# Patient Record
Sex: Male | Born: 1965 | Race: White | Hispanic: No | Marital: Single | Smoking: Former smoker
Health system: Southern US, Community
[De-identification: ages and names within clinical notes are randomized; demographics above are authoritative.]

## PROBLEM LIST (undated history)

## (undated) DIAGNOSIS — E559 Vitamin D deficiency, unspecified: Secondary | ICD-10-CM

## (undated) DIAGNOSIS — N183 Chronic kidney disease, stage 3 unspecified: Secondary | ICD-10-CM

## (undated) DIAGNOSIS — K219 Gastro-esophageal reflux disease without esophagitis: Secondary | ICD-10-CM

## (undated) DIAGNOSIS — N529 Male erectile dysfunction, unspecified: Secondary | ICD-10-CM

## (undated) DIAGNOSIS — E782 Mixed hyperlipidemia: Secondary | ICD-10-CM

## (undated) HISTORY — DX: Chronic kidney disease, stage 3 unspecified: N18.30

## (undated) HISTORY — DX: Mixed hyperlipidemia: E78.2

## (undated) HISTORY — DX: Gastro-esophageal reflux disease without esophagitis: K21.9

---
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
## 2014-09-13 ENCOUNTER — Ambulatory Visit (INDEPENDENT_AMBULATORY_CARE_PROVIDER_SITE_OTHER): Payer: BC Managed Care – PPO | Admitting: Physician Assistant

## 2014-09-13 ENCOUNTER — Encounter (INDEPENDENT_AMBULATORY_CARE_PROVIDER_SITE_OTHER): Payer: Self-pay

## 2014-09-13 ENCOUNTER — Encounter: Payer: Self-pay | Admitting: Physician Assistant

## 2014-09-13 VITALS — BP 130/80 | HR 79 | Temp 97.6°F | Ht 67.0 in | Wt 179.2 lb

## 2014-09-13 DIAGNOSIS — J069 Acute upper respiratory infection, unspecified: Secondary | ICD-10-CM

## 2014-09-13 NOTE — Patient Instructions (Signed)
Upper Respiratory Infection, Adult An upper respiratory infection (URI) is also sometimes known as the common cold. The upper respiratory tract includes the nose, sinuses, throat, trachea, and bronchi. Bronchi are the airways leading to the lungs. Most people improve within 1 week, but symptoms can last up to 2 weeks. A residual cough may last even longer.  CAUSES Many different viruses can infect the tissues lining the upper respiratory tract. The tissues become irritated and inflamed and often become very moist. Mucus production is also common. A cold is contagious. You can easily spread the virus to others by oral contact. This includes kissing, sharing a glass, coughing, or sneezing. Touching your mouth or nose and then touching a surface, which is then touched by another person, can also spread the virus. SYMPTOMS  Symptoms typically develop 1 to 3 days after you come in contact with a cold virus. Symptoms vary from person to person. They may include:  Runny nose.  Sneezing.  Nasal congestion.  Sinus irritation.  Sore throat.  Loss of voice (laryngitis).  Cough.  Fatigue.  Muscle aches.  Loss of appetite.  Headache.  Low-grade fever. DIAGNOSIS  You might diagnose your own cold based on familiar symptoms, since most people get a cold 2 to 3 times a year. Your caregiver can confirm this based on your exam. Most importantly, your caregiver can check that your symptoms are not due to another disease such as strep throat, sinusitis, pneumonia, asthma, or epiglottitis. Blood tests, throat tests, and X-rays are not necessary to diagnose a common cold, but they may sometimes be helpful in excluding other more serious diseases. Your caregiver will decide if any further tests are required. RISKS AND COMPLICATIONS  You may be at risk for a more severe case of the common cold if you smoke cigarettes, have chronic heart disease (such as heart failure) or lung disease (such as asthma), or if  you have a weakened immune system. The very young and very old are also at risk for more serious infections. Bacterial sinusitis, middle ear infections, and bacterial pneumonia can complicate the common cold. The common cold can worsen asthma and chronic obstructive pulmonary disease (COPD). Sometimes, these complications can require emergency medical care and may be life-threatening. PREVENTION  The best way to protect against getting a cold is to practice good hygiene. Avoid oral or hand contact with people with cold symptoms. Wash your hands often if contact occurs. There is no clear evidence that vitamin C, vitamin E, echinacea, or exercise reduces the chance of developing a cold. However, it is always recommended to get plenty of rest and practice good nutrition. TREATMENT  Treatment is directed at relieving symptoms. There is no cure. Antibiotics are not effective, because the infection is caused by a virus, not by bacteria. Treatment may include:  Increased fluid intake. Sports drinks offer valuable electrolytes, sugars, and fluids.  Breathing heated mist or steam (vaporizer or shower).  Eating chicken soup or other clear broths, and maintaining good nutrition.  Getting plenty of rest.  Using gargles or lozenges for comfort.  Controlling fevers with ibuprofen or acetaminophen as directed by your caregiver.  Increasing usage of your inhaler if you have asthma. Zinc gel and zinc lozenges, taken in the first 24 hours of the common cold, can shorten the duration and lessen the severity of symptoms. Pain medicines may help with fever, muscle aches, and throat pain. A variety of non-prescription medicines are available to treat congestion and runny nose. Your caregiver   can make recommendations and may suggest nasal or lung inhalers for other symptoms.  HOME CARE INSTRUCTIONS   Only take over-the-counter or prescription medicines for pain, discomfort, or fever as directed by your  caregiver.  Use a warm mist humidifier or inhale steam from a shower to increase air moisture. This may keep secretions moist and make it easier to breathe.  Drink enough water and fluids to keep your urine clear or pale yellow.  Rest as needed.  Return to work when your temperature has returned to normal or as your caregiver advises. You may need to stay home longer to avoid infecting others. You can also use a face mask and careful hand washing to prevent spread of the virus. SEEK MEDICAL CARE IF:   After the first few days, you feel you are getting worse rather than better.  You need your caregiver's advice about medicines to control symptoms.  You develop chills, worsening shortness of breath, or brown or red sputum. These may be signs of pneumonia.  You develop yellow or brown nasal discharge or pain in the face, especially when you bend forward. These may be signs of sinusitis.  You develop a fever, swollen neck glands, pain with swallowing, or white areas in the back of your throat. These may be signs of strep throat. SEEK IMMEDIATE MEDICAL CARE IF:   You have a fever.  You develop severe or persistent headache, ear pain, sinus pain, or chest pain.  You develop wheezing, a prolonged cough, cough up blood, or have a change in your usual mucus (if you have chronic lung disease).  You develop sore muscles or a stiff neck. Document Released: 09/08/2000 Document Revised: 06/07/2011 Document Reviewed: 06/20/2013 [employer] Patient Information 2015 [employer]. This information is not intended to replace advice given to you by your health care provider. Make sure you discuss any questions you have with your health care provider.  

## 2014-09-13 NOTE — Progress Notes (Signed)
Subjective:     Patient ID: Gregory Baas., male   DOB: 10/31/1965, 49 y.o.   MRN: [id number]  HPI Pt with ear pain, S/T, and now cough No fever /chills OTC Ibuprofen for sx   Review of Systems  Constitutional: Negative.   HENT: Positive for congestion, ear pain, postnasal drip and sore throat. Negative for rhinorrhea, sinus pressure, sneezing and voice change.   Respiratory: Positive for cough.   Cardiovascular: Negative.        Objective:   Physical Exam  Constitutional: He appears well-developed and well-nourished.  HENT:  Right Ear: External ear normal.  Left Ear: External ear normal.  Mouth/Throat: Oropharynx is clear and moist. No oropharyngeal exudate.  Cerumen to R ear canal but TM with nl landmarks  Neck: Neck supple.  Cardiovascular: Normal rate, regular rhythm and normal heart sounds.   Pulmonary/Chest: Effort normal and breath sounds normal.  Lymphadenopathy:    He has no cervical adenopathy.  Nursing note and vitals reviewed.      Assessment:     1. Acute upper respiratory infection        Plan:     Fluids Rest Salt water gargles OTC Antihist/decongest

## 2014-09-18 ENCOUNTER — Ambulatory Visit (INDEPENDENT_AMBULATORY_CARE_PROVIDER_SITE_OTHER): Payer: BC Managed Care – PPO | Admitting: Family Medicine

## 2014-09-18 ENCOUNTER — Telehealth: Payer: Self-pay | Admitting: Family Medicine

## 2014-09-18 VITALS — BP 118/70 | HR 83 | Temp 98.2°F | Ht 67.0 in | Wt 176.2 lb

## 2014-09-18 DIAGNOSIS — J0110 Acute frontal sinusitis, unspecified: Secondary | ICD-10-CM | POA: Diagnosis not present

## 2014-09-18 MED ORDER — LEVOFLOXACIN 500 MG PO TABS: 500.0000 mg | ORAL_TABLET | Freq: Every day | ORAL | Status: DC

## 2014-09-18 MED ORDER — BETAMETHASONE SOD PHOS & ACET 6 (3-3) MG/ML IJ SUSP
12.0000 mg | Freq: Once | INTRAMUSCULAR | Status: AC
  Administered 2014-09-18: 12 mg via INTRAMUSCULAR

## 2014-09-18 NOTE — Progress Notes (Signed)
Subjective:  Patient ID: Gregory Baas., male    DOB: June 29, 1965  Age: 49 y.o. MRN: [id number]  CC: URI   HPI Gregory Charles. presents for 10  Days of ST, congestion & cough worsening is spite of using Claritin D then Dayquil/Nyquil  History Gregory Charles has no past medical history on file.   He has no past surgical history on file.   His family history is not on file.He reports that he has quit smoking. He does not have any smokeless tobacco history on file. He reports that he drinks alcohol. He reports that he does not use illicit drugs.  No outpatient prescriptions prior to visit.   No facility-administered medications prior to visit.    ROS Review of Systems  Constitutional: Negative for fever, chills, activity change and appetite change.  HENT: Positive for congestion, postnasal drip, rhinorrhea and sinus pressure. Negative for ear discharge, ear pain, hearing loss, nosebleeds, sneezing and trouble swallowing.   Respiratory: Negative for chest tightness and shortness of breath.   Cardiovascular: Negative for chest pain and palpitations.  Skin: Negative for rash.    Objective:  BP 118/70 mmHg  Pulse 83  Temp(Src) 98.2 F (36.8 C) (Oral)  Ht 5\' 7"  (1.702 m)  Wt 176 lb 3.2 oz (79.924 kg)  BMI 27.59 kg/m2  BP Readings from Last 3 Encounters:  09/18/14 118/70  09/13/14 130/80    Wt Readings from Last 3 Encounters:  09/18/14 176 lb 3.2 oz (79.924 kg)  09/13/14 179 lb 3.2 oz (81.285 kg)     Physical Exam  Constitutional: He appears well-developed and well-nourished.  HENT:  Head: Normocephalic and atraumatic.  Right Ear: Tympanic membrane and external ear normal. No decreased hearing is noted.  Left Ear: Tympanic membrane and external ear normal. No decreased hearing is noted.  Nose: Mucosal edema present. Right sinus exhibits frontal sinus tenderness. Left sinus exhibits frontal sinus tenderness.  Mouth/Throat: No oropharyngeal exudate or posterior  oropharyngeal erythema.  Neck: No Brudzinski's sign noted.  Pulmonary/Chest: No respiratory distress. He has wheezes.  Lymphadenopathy:       Head (right side): No preauricular adenopathy present.       Head (left side): No preauricular adenopathy present.       Right cervical: No superficial cervical adenopathy present.      Left cervical: No superficial cervical adenopathy present.    No results found for: HGBA1C  No results found for: WBC, HGB, HCT, PLT, GLUCOSE, CHOL, TRIG, HDL, LDLDIRECT, LDLCALC, ALT, AST, NA, K, CL, CREATININE, BUN, CO2, TSH, PSA, INR, GLUF, HGBA1C, MICROALBUR  Patient was never admitted.  Assessment & Plan:   Gregory Charles was seen today for uri.  Diagnoses and all orders for this visit:  Acute frontal sinusitis, recurrence not specified Orders: -     betamethasone acetate-betamethasone sodium phosphate (CELESTONE) injection 12 mg; Inject 2 mLs (12 mg total) into the muscle once.  Other orders -     levofloxacin (LEVAQUIN) 500 MG tablet; Take 1 tablet (500 mg total) by mouth daily.   I am having Gregory Charles start on levofloxacin. We administered betamethasone acetate-betamethasone sodium phosphate.  Meds ordered this encounter  Medications  . levofloxacin (LEVAQUIN) 500 MG tablet    Sig: Take 1 tablet (500 mg total) by mouth daily.    Dispense:  10 tablet    Refill:  0  . betamethasone acetate-betamethasone sodium phosphate (CELESTONE) injection 12 mg    Sig:      Follow-up: No  Follow-up on file.  Claretta Fraise, M.D.

## 2014-09-18 NOTE — Telephone Encounter (Signed)
appt made

## 2015-01-07 ENCOUNTER — Telehealth: Payer: Self-pay | Admitting: Family Medicine

## 2015-01-07 ENCOUNTER — Encounter: Payer: Self-pay | Admitting: Family Medicine

## 2015-01-07 ENCOUNTER — Ambulatory Visit (INDEPENDENT_AMBULATORY_CARE_PROVIDER_SITE_OTHER): Payer: BC Managed Care – PPO | Admitting: Family Medicine

## 2015-01-07 VITALS — BP 122/79 | HR 77 | Temp 97.9°F | Ht 67.0 in | Wt 174.4 lb

## 2015-01-07 DIAGNOSIS — R5383 Other fatigue: Secondary | ICD-10-CM | POA: Insufficient documentation

## 2015-01-07 DIAGNOSIS — M6740 Ganglion, unspecified site: Secondary | ICD-10-CM | POA: Insufficient documentation

## 2015-01-07 DIAGNOSIS — M25519 Pain in unspecified shoulder: Secondary | ICD-10-CM | POA: Insufficient documentation

## 2015-01-07 DIAGNOSIS — Z0000 Encounter for general adult medical examination without abnormal findings: Secondary | ICD-10-CM | POA: Diagnosis not present

## 2015-01-07 DIAGNOSIS — R5382 Chronic fatigue, unspecified: Secondary | ICD-10-CM

## 2015-01-07 DIAGNOSIS — M25511 Pain in right shoulder: Secondary | ICD-10-CM

## 2015-01-07 MED ORDER — NITROGLYCERIN 0.4 MG/HR TD PT24: MEDICATED_PATCH | TRANSDERMAL | Status: DC

## 2015-01-07 NOTE — Telephone Encounter (Signed)
[facility] is faxing over paper for rejection of rx

## 2015-01-07 NOTE — Patient Instructions (Addendum)
  Great to see you!  Come back between 8 and 10 am at your convenience for the testosterone test.    Nitroglycerin Protocol   Apply 1/4 nitroglycerin patch to affected area daily.  Change position of patch within the affected area every 24 hours.  You may experience a headache during the first 1-2 weeks of using the patch, these should subside.  If you experience headaches after beginning nitroglycerin patch treatment, you may take your preferred over the counter pain reliever.  Another side effect of the nitroglycerin patch is skin irritation or rash related to patch adhesive.  Please notify our office if you develop more severe headaches or rash, and stop the patch.  Tendon healing with nitroglycerin patch may require 12 to 24 weeks depending on the extent of injury.  Men should not use if taking Viagra, Cialis, or Levitra.   Do not use if you have migraines or rosacea.
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
## 2015-01-08 LAB — CMP14+EGFR
ALBUMIN: 4.6 g/dL (ref 3.5–5.5)
ALK PHOS: 92 IU/L (ref 39–117)
ALT: 20 IU/L (ref 0–44)
AST: 17 IU/L (ref 0–40)
Albumin/Globulin Ratio: 1.9 (ref 1.1–2.5)
BILIRUBIN TOTAL: 0.7 mg/dL (ref 0.0–1.2)
BUN / CREAT RATIO: 7 — AB (ref 9–20)
BUN: 9 mg/dL (ref 6–24)
CHLORIDE: 98 mmol/L (ref 97–108)
CO2: 25 mmol/L (ref 18–29)
Calcium: 9.7 mg/dL (ref 8.7–10.2)
Creatinine, Ser: 1.38 mg/dL — ABNORMAL HIGH (ref 0.76–1.27)
GFR calc Af Amer: 70 mL/min/{1.73_m2} (ref >59)
GFR calc non Af Amer: 60 mL/min/{1.73_m2} (ref >59)
GLOBULIN, TOTAL: 2.4 g/dL (ref 1.5–4.5)
Glucose: 93 mg/dL (ref 65–99)
Potassium: 4.4 mmol/L (ref 3.5–5.2)
SODIUM: 138 mmol/L (ref 134–144)
TOTAL PROTEIN: 7 g/dL (ref 6.0–8.5)

## 2015-01-08 LAB — LIPID PANEL
Chol/HDL Ratio: 5.4 ratio units — ABNORMAL HIGH (ref 0.0–5.0)
Cholesterol, Total: 231 mg/dL — ABNORMAL HIGH (ref 100–199)
HDL: 43 mg/dL (ref >39)
LDL Calculated: 147 mg/dL — ABNORMAL HIGH (ref 0–99)
TRIGLYCERIDES: 206 mg/dL — AB (ref 0–149)
VLDL Cholesterol Cal: 41 mg/dL — ABNORMAL HIGH (ref 5–40)

## 2015-01-08 LAB — CBC
HEMATOCRIT: 45.3 % (ref 37.5–51.0)
Hemoglobin: 16.1 g/dL (ref 12.6–17.7)
MCH: 33.1 pg — ABNORMAL HIGH (ref 26.6–33.0)
MCHC: 35.5 g/dL (ref 31.5–35.7)
MCV: 93 fL (ref 79–97)
Platelets: 247 10*3/uL (ref 150–379)
RBC: 4.87 x10E6/uL (ref 4.14–5.80)
RDW: 13.6 % (ref 12.3–15.4)
WBC: 7.8 10*3/uL (ref 3.4–10.8)

## 2015-01-08 NOTE — Progress Notes (Signed)
Patient aware.

## 2015-01-09 ENCOUNTER — Telehealth: Payer: Self-pay

## 2015-01-09 NOTE — Telephone Encounter (Signed)
I have to prior authorize Nitroglycerin patch for patient   Are you using this for pain in his shoulder?

## 2015-02-07 ENCOUNTER — Encounter: Payer: Self-pay | Admitting: Family Medicine

## 2015-02-07 ENCOUNTER — Ambulatory Visit (INDEPENDENT_AMBULATORY_CARE_PROVIDER_SITE_OTHER): Payer: BC Managed Care – PPO | Admitting: Family Medicine

## 2015-02-07 ENCOUNTER — Ambulatory Visit (INDEPENDENT_AMBULATORY_CARE_PROVIDER_SITE_OTHER): Payer: BC Managed Care – PPO

## 2015-02-07 VITALS — BP 116/73 | HR 83 | Temp 97.9°F | Ht 67.0 in | Wt 178.4 lb

## 2015-02-07 DIAGNOSIS — M7521 Bicipital tendinitis, right shoulder: Secondary | ICD-10-CM

## 2015-02-07 DIAGNOSIS — M25511 Pain in right shoulder: Secondary | ICD-10-CM

## 2015-02-07 DIAGNOSIS — R5382 Chronic fatigue, unspecified: Secondary | ICD-10-CM

## 2015-02-07 MED ORDER — TRAMADOL HCL 50 MG PO TABS: 50.0000 mg | ORAL_TABLET | Freq: Four times a day (QID) | ORAL | Status: DC | PRN

## 2015-02-07 MED ORDER — PREDNISONE 10 MG PO TABS: ORAL_TABLET | ORAL | Status: DC

## 2015-02-07 NOTE — Progress Notes (Signed)
Subjective:  Patient ID: Gregory BaasHARLES H Rotenberg Jr., male    DOB: Mar 06, 1966  Age: 49 y.o. MRN: [id number]  CC: Shoulder Pain   HPI Gregory BaasHARLES H Power Jr. presents for intense right shoulder pain in spite of home physical therapy. He was treated by Dr. Ermalinda MemosBradshaw recently with nitroglycerin patches that caused a headache that was too intense to allow him to continue with the treatment. At the same time he was trying to do home physical therapy with an exercise regimen provided at the same evaluation. He states that the exercises have not helped. He requests an MRI of the shoulder and would like to avoid physical therapy formally until he knows if he has a surgical lesion. Pain is anterior. He points to the anterior aspect of the deltoid near its insertion. He says it is painful for arm flexion.  History Gregory Charles has no past medical history on file.   He has no past surgical history on file.   His family history is not on file.He reports that he has quit smoking. He does not have any smokeless tobacco history on file. He reports that he drinks alcohol. He reports that he does not use illicit drugs.  Outpatient Prescriptions Prior to Visit  Medication Sig Dispense Refill  . nitroGLYCERIN (NITRO-DUR) 0.4 mg/hr patch Place 1/4 of the patch to the affected area daily. (Patient not taking: Reported on 02/07/2015) 10 patch 2   No facility-administered medications prior to visit.    ROS Review of Systems  Constitutional: Negative for fever, chills and diaphoresis.  HENT: Negative for congestion and rhinorrhea.   Respiratory: Negative for cough and shortness of breath.   Cardiovascular: Negative for chest pain and palpitations.  Musculoskeletal: Positive for myalgias, joint swelling and arthralgias.  Skin: Negative for rash.  Neurological: Negative for headaches.    Objective:  BP 116/73 mmHg  Pulse 83  Temp(Src) 97.9 F (36.6 C) (Oral)  Ht 5\' 7"  (1.702 m)  Wt 178 lb 6.4 oz (80.922 kg)  BMI  27.93 kg/m2  SpO2 96%  BP Readings from Last 3 Encounters:  02/07/15 116/73  01/07/15 122/79  09/18/14 118/70    Wt Readings from Last 3 Encounters:  02/07/15 178 lb 6.4 oz (80.922 kg)  01/07/15 174 lb 6.4 oz (79.107 kg)  09/18/14 176 lb 3.2 oz (79.924 kg)     Physical Exam  Constitutional: He is oriented to person, place, and time. He appears well-developed and well-nourished.  HENT:  Head: Normocephalic and atraumatic.  Eyes: Pupils are equal, round, and reactive to light.  Cardiovascular: Normal rate and regular rhythm.   No murmur heard. Pulmonary/Chest: Effort normal and breath sounds normal.  Musculoskeletal: He exhibits tenderness (tenderness at the origin of the biceps tendon. Excruciating pain with resisted flexion and resisted supination of the right biceps).  Neurological: He is alert and oriented to person, place, and time. He displays normal reflexes. He exhibits normal muscle tone. Coordination normal.  Skin: Skin is warm and dry.  Psychiatric: He has a normal mood and affect.    No results found for: HGBA1C  Lab Results  Component Value Date   WBC 7.8 01/07/2015   HCT 45.3 01/07/2015   GLUCOSE 93 01/07/2015   CHOL 231* 01/07/2015   TRIG 206* 01/07/2015   HDL 43 01/07/2015   LDLCALC 147* 01/07/2015   ALT 20 01/07/2015   AST 17 01/07/2015   NA 138 01/07/2015   K 4.4 01/07/2015   CL 98 01/07/2015   CREATININE 1.38*  01/07/2015   BUN 9 01/07/2015   CO2 25 01/07/2015    Patient was never admitted.  Assessment & Plan:   Charles was seen today for shoulder pain.  Diagnoses and all orders for this visit:  Right shoulder pain -     DG Shoulder Right -     MR Shoulder Right Wo Contrast; Future  Chronic fatigue -     Testosterone,Free and Total  Biceps tendonitis on right -     MR Shoulder Right Wo Contrast; Future  Other orders -     predniSONE (DELTASONE) 10 MG tablet; Take 5 daily for 3 days followed by 4,3,2 and 1 for 3 days each. -      traMADol (ULTRAM) 50 MG tablet; Take 1 tablet (50 mg total) by mouth 4 (four) times daily as needed for moderate pain.   I have discontinued [name] nitroGLYCERIN. I am also having him start on predniSONE and traMADol.  Meds ordered this encounter  Medications  . predniSONE (DELTASONE) 10 MG tablet    Sig: Take 5 daily for 3 days followed by 4,3,2 and 1 for 3 days each.    Dispense:  45 tablet    Refill:  0  . traMADol (ULTRAM) 50 MG tablet    Sig: Take 1 tablet (50 mg total) by mouth 4 (four) times daily as needed for moderate pain.    Dispense:  60 tablet    Refill:  02     Follow-up: Return in about 2 weeks (around 02/21/2015), or if symptoms worsen or fail to improve.  Mechele Claude, M.D.

## 2015-02-08 LAB — TESTOSTERONE,FREE AND TOTAL
TESTOSTERONE: 460 ng/dL (ref 348–1197)
Testosterone, Free: 14.8 pg/mL (ref 6.8–21.5)

## 2015-02-21 ENCOUNTER — Ambulatory Visit (HOSPITAL_COMMUNITY)
Admission: RE | Admit: 2015-02-21 | Discharge: 2015-02-21 | Disposition: A | Discharge status: Home / Self Care | Payer: BC Managed Care – PPO | Source: Ambulatory Visit | Attending: Family Medicine | Admitting: Family Medicine

## 2015-02-21 ENCOUNTER — Other Ambulatory Visit: Payer: Self-pay | Admitting: Family Medicine

## 2015-02-21 ENCOUNTER — Ambulatory Visit (HOSPITAL_COMMUNITY): Payer: BC Managed Care – PPO

## 2015-02-21 DIAGNOSIS — M7521 Bicipital tendinitis, right shoulder: Secondary | ICD-10-CM

## 2015-02-21 DIAGNOSIS — S43432A Superior glenoid labrum lesion of left shoulder, initial encounter: Secondary | ICD-10-CM

## 2015-02-21 DIAGNOSIS — M19011 Primary osteoarthritis, right shoulder: Secondary | ICD-10-CM | POA: Insufficient documentation

## 2015-02-21 DIAGNOSIS — M25511 Pain in right shoulder: Secondary | ICD-10-CM

## 2015-02-21 DIAGNOSIS — Z01818 Encounter for other preprocedural examination: Secondary | ICD-10-CM | POA: Diagnosis not present

## 2015-06-18 ENCOUNTER — Encounter: Payer: Self-pay | Admitting: Family Medicine

## 2015-06-18 ENCOUNTER — Ambulatory Visit (INDEPENDENT_AMBULATORY_CARE_PROVIDER_SITE_OTHER): Payer: BC Managed Care – PPO | Admitting: Family Medicine

## 2015-06-18 VITALS — BP 121/80 | HR 84 | Temp 98.0°F | Ht 67.0 in | Wt 173.6 lb

## 2015-06-18 DIAGNOSIS — S0086XA Insect bite (nonvenomous) of other part of head, initial encounter: Secondary | ICD-10-CM | POA: Diagnosis not present

## 2015-06-18 DIAGNOSIS — W57XXXA Bitten or stung by nonvenomous insect and other nonvenomous arthropods, initial encounter: Secondary | ICD-10-CM

## 2015-06-18 MED ORDER — MUPIROCIN CALCIUM 2 % EX CREA: 1.0000 "application " | TOPICAL_CREAM | Freq: Two times a day (BID) | CUTANEOUS | Status: DC

## 2015-06-18 MED ORDER — CEPHALEXIN 500 MG PO CAPS: 500.0000 mg | ORAL_CAPSULE | Freq: Four times a day (QID) | ORAL | Status: DC

## 2015-06-18 NOTE — Progress Notes (Signed)
   Subjective:    Patient ID: Gregory BaasHARLES H Auzenne Jr., male    DOB: 11/10/1965, 50 y.o.   MRN: [id number]  HPI patient was bit by unknown insect about a week ago area in question is getting redder and there is more swelling below. The actual bite is on the bridge of the nose on the left side. He is also having some headaches. He has been doing some warm compresses.  Patient Active Problem List   Diagnosis Date Noted  . Biceps tendonitis on right 02/07/2015  . Annual physical exam 01/07/2015  . Pain in joint, shoulder region 01/07/2015  . Ganglion cyst 01/07/2015  . Fatigue 01/07/2015   Outpatient Encounter Prescriptions as of 06/18/2015  Medication Sig  . [DISCONTINUED] predniSONE (DELTASONE) 10 MG tablet Take 5 daily for 3 days followed by 4,3,2 and 1 for 3 days each.  . [DISCONTINUED] traMADol (ULTRAM) 50 MG tablet Take 1 tablet (50 mg total) by mouth 4 (four) times daily as needed for moderate pain.   No facility-administered encounter medications on file as of 06/18/2015.      Review of Systems  Skin: Positive for color change and wound.       Objective:   Physical Exam  Constitutional: He appears well-developed and well-nourished.  Skin:  Insect bite on left side of nose between eyes: Area is red and irritated with soft tissue swelling extending over to the malar area under the left eye. This is not orbital cellulitis but I think there is evidence of infection.          Assessment & Plan:  1. Insect bite of face with local reaction, initial encounter Will have patient alternate cool and warm compresses. Rx for Keflex 500 mg 4 times a day along with Bactroban topically.

## 2015-06-18 NOTE — Patient Instructions (Signed)
Thank you for allowing us to care for you today. We strive to provide exceptional quality and compassionate care. Please let us know how we are doing and how we can help serve you better by filling out the survey that you receive from [employer].     

## 2016-01-28 ENCOUNTER — Encounter: Payer: Self-pay | Admitting: Physician Assistant

## 2016-01-28 ENCOUNTER — Ambulatory Visit (INDEPENDENT_AMBULATORY_CARE_PROVIDER_SITE_OTHER): Payer: BC Managed Care – PPO | Admitting: Physician Assistant

## 2016-01-28 VITALS — BP 115/72 | HR 55 | Temp 98.4°F | Ht 67.0 in | Wt 177.4 lb

## 2016-01-28 DIAGNOSIS — H8113 Benign paroxysmal vertigo, bilateral: Secondary | ICD-10-CM

## 2016-01-28 MED ORDER — MECLIZINE HCL 12.5 MG PO TABS: 25.0000 mg | ORAL_TABLET | Freq: Three times a day (TID) | ORAL | 0 refills | Status: DC

## 2016-01-28 NOTE — Patient Instructions (Signed)

## 2016-01-30 NOTE — Progress Notes (Signed)
BP 115/72   Pulse (!) 55   Temp 98.4 F (36.9 C) (Oral)   Ht 5\' 7"  (1.702 m)   Wt 177 lb 6.4 oz (80.5 kg)   BMI 27.78 kg/m    Subjective:    Patient ID: Gregory BaasHARLES H Rayson Jr., male    DOB: 04-22-1965, 50 y.o.   MRN: [id number]  HPI: Gregory BaasCHARLES H Valencia Jr. is a 50 y.o. male presenting on 01/28/2016 for Dizziness (Started yesterday ) For two days has dizziness with changing positions. Dizziness is throughout head, No associated NVD, fever or chills. Denies any recent cold or URI symptoms. Has not cardiac history of bradycardia. Mild slowing today, will continue to monitor in coming days.    History reviewed. No pertinent past medical history. Relevant past medical, surgical, family and social history reviewed and updated as indicated. Interim medical history since our last visit reviewed. Allergies and medications reviewed and updated. DATA REVIEWED: CHART IN EPIC  Social History   Social History  . Marital status: Single    Spouse name: N/A  . Number of children: N/A  . Years of education: N/A   Occupational History  . Not on file.   Social History Main Topics  . Smoking status: Former Games developermoker  . Smokeless tobacco: Never Used  . Alcohol use Yes  . Drug use: No  . Sexual activity: Not on file   Other Topics Concern  . Not on file   Social History Narrative  . No narrative on file    History reviewed. No pertinent surgical history.  History reviewed. No pertinent family history.  Review of Systems  Constitutional: Negative.  Negative for appetite change and fatigue.  HENT: Negative.  Negative for ear discharge, ear pain, facial swelling, postnasal drip, rhinorrhea and sinus pressure.   Eyes: Negative.  Negative for pain, discharge, itching and visual disturbance.  Respiratory: Negative.  Negative for cough, chest tightness, shortness of breath and wheezing.   Cardiovascular: Negative.  Negative for chest pain, palpitations and leg swelling.  Gastrointestinal:  Negative.  Negative for abdominal pain, diarrhea, nausea and vomiting.  Endocrine: Negative.   Genitourinary: Negative.   Musculoskeletal: Negative.   Skin: Negative.  Negative for color change and rash.  Neurological: Positive for dizziness and light-headedness. Negative for tremors, seizures, syncope, facial asymmetry, weakness, numbness and headaches.  Psychiatric/Behavioral: Negative.       Medication List       Accurate as of 01/28/16 11:59 PM. Always use your most recent med list.          meclizine 12.5 MG tablet Commonly known as:  ANTIVERT Take 2 tablets (25 mg total) by mouth 3 (three) times daily.          Objective:    BP 115/72   Pulse (!) 55   Temp 98.4 F (36.9 C) (Oral)   Ht 5\' 7"  (1.702 m)   Wt 177 lb 6.4 oz (80.5 kg)   BMI 27.78 kg/m   No Known Allergies  Wt Readings from Last 3 Encounters:  01/28/16 177 lb 6.4 oz (80.5 kg)  06/18/15 173 lb 9.6 oz (78.7 kg)  02/21/15 172 lb (78 kg)    Physical Exam  Constitutional: He is oriented to person, place, and time. He appears well-developed and well-nourished. No distress.  HENT:  Head: Normocephalic and atraumatic.  Right Ear: External ear normal.  Left Ear: External ear normal.  Nose: Nose normal.  Mouth/Throat: Oropharynx is clear and moist. No oropharyngeal exudate.  Eyes:  Conjunctivae and EOM are normal. Pupils are equal, round, and reactive to light. Right eye exhibits no discharge. Left eye exhibits no discharge.  Neck: Normal range of motion. Neck supple. No tracheal deviation present. No thyromegaly present.  Cardiovascular: Normal rate, regular rhythm and normal heart sounds.   Pulmonary/Chest: Effort normal and breath sounds normal. No respiratory distress.  Neurological: He is alert and oriented to person, place, and time. He has normal strength. He is not disoriented. No sensory deficit. Coordination abnormal. Gait normal.  Skin: Skin is warm and dry.  Psychiatric: He has a normal mood  and affect. His behavior is normal.  Nursing note and vitals reviewed.       Assessment & Plan:   1. Benign paroxysmal positional vertigo due to bilateral vestibular disorder - meclizine (ANTIVERT) 12.5 MG tablet; Take 2 tablets (25 mg total) by mouth 3 (three) times daily.  Dispense: 30 tablet; Refill: 0   Continue all other maintenance medications as listed above.  Follow up plan: Return if symptoms worsen or fail to improve.   Educational handout given for vertigo  Remus LofflerAngel S. Shawnte Demarest PA-C [facility] [address]  [place], [place] [id number] [phone]   01/30/2016, 1:55 PM

## 2016-02-02 ENCOUNTER — Telehealth: Payer: Self-pay | Admitting: Physician Assistant

## 2016-02-02 NOTE — Telephone Encounter (Signed)
Probably smart to be seen again to try epleys maneuver and repeat exam.   Murtis SinkSam Carolan Avedisian, MD [facility] 02/02/2016, 5:20 PM

## 2016-02-02 NOTE — Telephone Encounter (Signed)
Patient was seen by Gregory Charles on 01/28/16 for vertigo and placed on Meclizine 25 mg, 2 tabs three times daily.  He has been taking the medication as prescribed but is still experiencing dizziness and lightheadedness.  He reports he does not have any sinus congestion, fluid in ears, cold symptoms.  Please advise.

## 2016-02-03 ENCOUNTER — Encounter: Payer: Self-pay | Admitting: Family

## 2016-02-03 ENCOUNTER — Ambulatory Visit (INDEPENDENT_AMBULATORY_CARE_PROVIDER_SITE_OTHER): Payer: BC Managed Care – PPO | Admitting: Family

## 2016-02-03 VITALS — BP 130/84 | HR 70 | Temp 97.3°F | Ht 67.0 in | Wt 179.0 lb

## 2016-02-03 DIAGNOSIS — R42 Dizziness and giddiness: Secondary | ICD-10-CM

## 2016-02-03 DIAGNOSIS — H539 Unspecified visual disturbance: Secondary | ICD-10-CM

## 2016-02-03 NOTE — Patient Instructions (Signed)

## 2016-02-03 NOTE — Progress Notes (Addendum)
Subjective:    Patient ID: Gregory H Bohnenkamp Jr., male    DOB: 08/08/1965, 50 y.o.   MRN: [id number]  Pt presents to the office today for recurrent dizziness that started a week ago. PT was seen in the office on 01/28/16 and given antivert with no relief. States he is having blurred vision.  Dizziness  This is a recurrent problem. The current episode started in the past 7 days. The problem occurs constantly. The problem has been waxing and waning. Associated symptoms include headaches and nausea. Pertinent negatives include no chills, congestion, coughing, sore throat, urinary symptoms or vomiting. The symptoms are aggravated by bending. He has tried lying down for the symptoms. The treatment provided moderate relief.  Headache   Associated symptoms include dizziness and nausea. Pertinent negatives include no coughing, sore throat or vomiting.      Review of Systems  Constitutional: Negative for chills.  HENT: Negative for congestion and sore throat.   Respiratory: Negative for cough.   Gastrointestinal: Positive for nausea. Negative for vomiting.  Neurological: Positive for dizziness and headaches.  All other systems reviewed and are negative.      Objective:   Physical Exam  Constitutional: He is oriented to person, place, and time. He appears well-developed and well-nourished. No distress.  HENT:  Head: Normocephalic.  Right Ear: External ear normal.  Left Ear: External ear normal.  Nose: Nose normal.  Mouth/Throat: Oropharynx is clear and moist.  Eyes: Pupils are equal, round, and reactive to light. Right eye exhibits no discharge. Left eye exhibits no discharge.  Neck: Normal range of motion. Neck supple. No thyromegaly present.  Cardiovascular: Normal rate, regular rhythm, normal heart sounds and intact distal pulses.   No murmur heard. Pulmonary/Chest: Effort normal and breath sounds normal. No respiratory distress. He has no wheezes.  Abdominal: Soft. Bowel sounds are  normal. He exhibits no distension. There is no tenderness.  Musculoskeletal: Normal range of motion. He exhibits no edema or tenderness.  Able to balance on one foot, gait unstable.   Neurological: He is alert and oriented to person, place, and time.  Skin: Skin is warm and dry. No rash noted. No erythema.  Psychiatric: He has a normal mood and affect. His behavior is normal. Judgment and thought content normal.  Vitals reviewed.    BP 130/84   Pulse 70   Temp 97.3 F (36.3 C) (Oral)   Ht 5' 7" (1.702 m)   Wt 179 lb (81.2 kg)   BMI 28.04 kg/m      Assessment & Plan:  1. Dizziness - CT Head Wo Contrast; Future - CMP14+EGFR - CBC with Differential/Platelet  2. Vision changes - CT Head Wo Contrast; Future - CMP14+EGFR - CBC with Differential/Platelet  Falls precautions discussed Labs pending Will do CT scan to rule out any other causes, if normal will treat as BPPV  Christy Hawks, FNP  

## 2016-02-03 NOTE — Telephone Encounter (Signed)
Patient aware and appointment scheduled.  

## 2016-02-04 ENCOUNTER — Ambulatory Visit (HOSPITAL_COMMUNITY): Payer: BC Managed Care – PPO

## 2016-02-04 ENCOUNTER — Telehealth: Payer: Self-pay | Admitting: Family Medicine

## 2016-02-04 LAB — CMP14+EGFR
A/G RATIO: 2.1 (ref 1.2–2.2)
ALT: 42 IU/L (ref 0–44)
AST: 27 IU/L (ref 0–40)
Albumin: 4.4 g/dL (ref 3.5–5.5)
Alkaline Phosphatase: 88 IU/L (ref 39–117)
BILIRUBIN TOTAL: 0.5 mg/dL (ref 0.0–1.2)
BUN/Creatinine Ratio: 8 — ABNORMAL LOW (ref 9–20)
BUN: 11 mg/dL (ref 6–24)
CALCIUM: 9.1 mg/dL (ref 8.7–10.2)
CHLORIDE: 103 mmol/L (ref 96–106)
CO2: 23 mmol/L (ref 18–29)
Creatinine, Ser: 1.46 mg/dL — ABNORMAL HIGH (ref 0.76–1.27)
GFR calc Af Amer: 65 mL/min/{1.73_m2} (ref >59)
GFR, EST NON AFRICAN AMERICAN: 56 mL/min/{1.73_m2} — AB (ref >59)
GLUCOSE: 86 mg/dL (ref 65–99)
Globulin, Total: 2.1 g/dL (ref 1.5–4.5)
POTASSIUM: 4.2 mmol/L (ref 3.5–5.2)
Sodium: 142 mmol/L (ref 134–144)
Total Protein: 6.5 g/dL (ref 6.0–8.5)

## 2016-02-04 LAB — CBC WITH DIFFERENTIAL/PLATELET
BASOS ABS: 0 10*3/uL (ref 0.0–0.2)
Basos: 1 %
EOS (ABSOLUTE): 0.4 10*3/uL (ref 0.0–0.4)
Eos: 5 %
Hematocrit: 43.3 % (ref 37.5–51.0)
Hemoglobin: 15.6 g/dL (ref 12.6–17.7)
IMMATURE GRANS (ABS): 0 10*3/uL (ref 0.0–0.1)
IMMATURE GRANULOCYTES: 0 %
LYMPHS: 35 %
Lymphocytes Absolute: 2.3 10*3/uL (ref 0.7–3.1)
MCH: 33.4 pg — ABNORMAL HIGH (ref 26.6–33.0)
MCHC: 36 g/dL — ABNORMAL HIGH (ref 31.5–35.7)
MCV: 93 fL (ref 79–97)
Monocytes Absolute: 0.5 10*3/uL (ref 0.1–0.9)
Monocytes: 8 %
NEUTROS PCT: 51 %
Neutrophils Absolute: 3.4 10*3/uL (ref 1.4–7.0)
PLATELETS: 247 10*3/uL (ref 150–379)
RBC: 4.67 x10E6/uL (ref 4.14–5.80)
RDW: 12.8 % (ref 12.3–15.4)
WBC: 6.7 10*3/uL (ref 3.4–10.8)

## 2016-02-04 NOTE — Telephone Encounter (Signed)
Ok, pt needs to exercises for Vertigo. He can Stage managergoogle Epley Maneuver to see if this helps. If dizziness does not improve or becomes worse, or any changes in vision, speech, or gait he needs to go to the ED

## 2016-02-04 NOTE — Telephone Encounter (Signed)
Aware. 

## 2016-02-05 ENCOUNTER — Other Ambulatory Visit: Payer: Self-pay | Admitting: Family

## 2016-02-05 DIAGNOSIS — R7989 Other specified abnormal findings of blood chemistry: Secondary | ICD-10-CM

## 2017-01-03 ENCOUNTER — Ambulatory Visit (INDEPENDENT_AMBULATORY_CARE_PROVIDER_SITE_OTHER): Payer: BC Managed Care – PPO | Admitting: Family Medicine

## 2017-01-03 ENCOUNTER — Encounter: Payer: Self-pay | Admitting: Family Medicine

## 2017-01-03 ENCOUNTER — Ambulatory Visit (INDEPENDENT_AMBULATORY_CARE_PROVIDER_SITE_OTHER): Payer: BC Managed Care – PPO

## 2017-01-03 VITALS — BP 127/81 | HR 72 | Temp 97.5°F | Ht 67.0 in | Wt 176.6 lb

## 2017-01-03 DIAGNOSIS — M25521 Pain in right elbow: Secondary | ICD-10-CM

## 2017-01-03 MED ORDER — MELOXICAM 15 MG PO TABS: 15.0000 mg | ORAL_TABLET | Freq: Every day | ORAL | 0 refills | Status: DC

## 2017-01-03 NOTE — Progress Notes (Signed)
BP 127/81   Pulse 72   Temp (!) 97.5 F (36.4 C) (Oral)   Ht  (1.702 m)   Wt 176 lb 9.6 oz (80.1 kg)   BMI 27.66 kg/m    Subjective:    Patient ID: Gregory Charles., male    DOB: Jun 19, 1965, 51 y.o.   MRN: [id number]  HPI: Gregory ZURAWSKI. is a 51 y.o. male presenting on 01/03/2017 for sharp, burning, pain originating from his right elbow and radiating into his right posterior upper arm, forearm, and the back of his right hand between his 2nd and 3rd digits. He states that the pain as been becoming gradually worse since an injury where he hit his elbow on a bolt four months ago. The pain is exacerbated by palpation and extension of his arm and he reports reduced grip strength. He has taken tylenol and ibuprofen with some relief. He reports an old injury to his right biceps tendon that improved with a Cortizone injection, and on which he declined surgery.   HPIRelevant past medical, surgical, family and social history reviewed and updated as indicated. Interim medical history since our last visit reviewed. Allergies and medications reviewed and updated.  Review of Systems  Constitutional: Negative for chills, diaphoresis and fever.  HENT: Negative for congestion, rhinorrhea, sinus pain, sinus pressure, sneezing and sore throat.   Respiratory: Negative for cough, shortness of breath and wheezing.   Cardiovascular: Negative for chest pain, palpitations and leg swelling.  Gastrointestinal: Negative for nausea and vomiting.  Musculoskeletal: Positive for arthralgias, joint swelling and myalgias. Negative for back pain and neck pain.  Neurological: Negative for dizziness, weakness, light-headedness and headaches.  Psychiatric/Behavioral: Negative for agitation, behavioral problems and confusion.   Per HPI unless specifically indicated above     Objective:    BP 127/81   Pulse 72   Temp (!) 97.5 F (36.4 C) (Oral)   Ht  (1.702 m)   Wt 176 lb 9.6 oz (80.1 kg)   BMI  27.66 kg/m   Wt Readings from Last 3 Encounters:  01/03/17 176 lb 9.6 oz (80.1 kg)  02/03/16 179 lb (81.2 kg)  01/28/16 177 lb 6.4 oz (80.5 kg)    Physical Exam  Constitutional: He is oriented to person, place, and time. He appears well-developed and well-nourished. No distress.  HENT:  Head: Normocephalic and atraumatic.  Mouth/Throat: Oropharynx is clear and moist. No oropharyngeal exudate.  Eyes: Pupils are equal, round, and reactive to light. Conjunctivae and EOM are normal.  Neck: Normal range of motion. Neck supple. No thyromegaly present.  Cardiovascular: Normal rate, regular rhythm and normal heart sounds.   No murmur heard. Pulmonary/Chest: Effort normal and breath sounds normal. No respiratory distress. He has no wheezes. He has no rales.  Abdominal: Soft. Bowel sounds are normal.  Musculoskeletal: Normal range of motion. He exhibits tenderness (TTP over right medical olecranon). He exhibits no edema or deformity.  Severe point TTP over right medial olecranon, exacerbated by forearm extension and gripping with his right hand. 5/5 strength. Full ROM. No erythema or edema.   Neurological: He is alert and oriented to person, place, and time. He has normal reflexes. Coordination normal.  Skin: Skin is warm and dry. No rash noted. He is not diaphoretic. No erythema. No pallor.  Psychiatric: He has a normal mood and affect. His behavior is normal. Judgment and thought content normal.  Nursing note and vitals reviewed.  X-Ray: DG Elbow 2 Views Right -  no focal findings observed, will be over-read by radiologist.     Assessment & Plan:   Problem List Items Addressed This Visit    None    Visit Diagnoses    Elbow pain, right    -  Primary   Relevant Medications   meloxicam (MOBIC) 15 MG tablet   Other Relevant Orders   DG Elbow 2 Views Right (Completed)   Ambulatory referral to Orthopedic Surgery      Gregory HEVIA. is a 51 y.o. male presenting on 01/03/2017 for sharp,  burning, pain originating from his right elbow and radiating into his right posterior upper arm, forearm, and the back of his right hand between his 2nd and 3rd digits. The pain is 10/10 with full forearm extension. On exam he has severe point TTP over right medial olecranon, exacerbated by forearm extension and gripping with his right hand. 5/5 strength. Full ROM. No erythema or edema.  I did not see any focal findings on X-ray. The point tenderness is not in the location of a particular bursa. Given the severity of the pain after an acute injury I am concerned this is more complicated than a tendonitis, and I will refer to an orthopedics. His pain improved with ibuprofen, so I will prescribe meloxicam  until he can see the orthopedist.   Follow up plan: Return if symptoms worsen or fail to improve.  Counseling provided for all of the vaccine components Orders Placed This Encounter  Procedures  . DG Elbow 2 Views Right   Patient seen and examined with Gregory Charles Medical student. Agree with assessment and plan above.   Arville Care, MD [facility] 01/03/2017, 5:16 PM

## 2017-01-28 IMAGING — MR MR SHOULDER*R* W/O CM
4 of 7 series · 19 of 40 positions shown · non-contrast
Comparison: Plain films left shoulder 02/07/2015.

CLINICAL DATA: Anterior right shoulder pain which is worse with
lifting for 5 months. No known injury. Subsequent encounter.

EXAM:
MRI OF THE RIGHT SHOULDER WITHOUT CONTRAST
TECHNIQUE: Multiplanar, multisequence MR imaging of the shoulder was performed.
No intravenous contrast was administered.

[Series 3: t2fs axial · axial · 3.0mm · 0.25mm/px · z∈[-21,+61]mm · 4 of 24 slices shown]
[im 1/24]
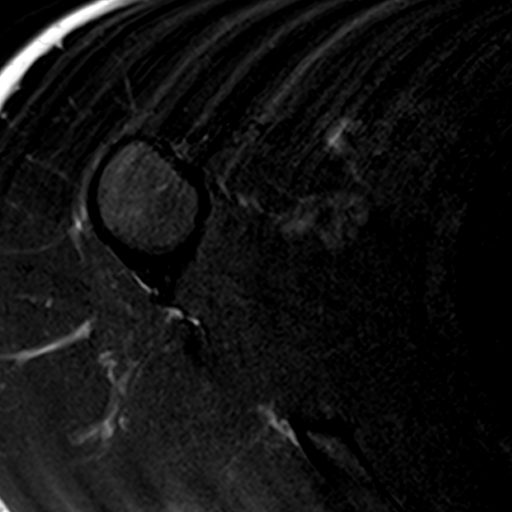
[im 6/24]
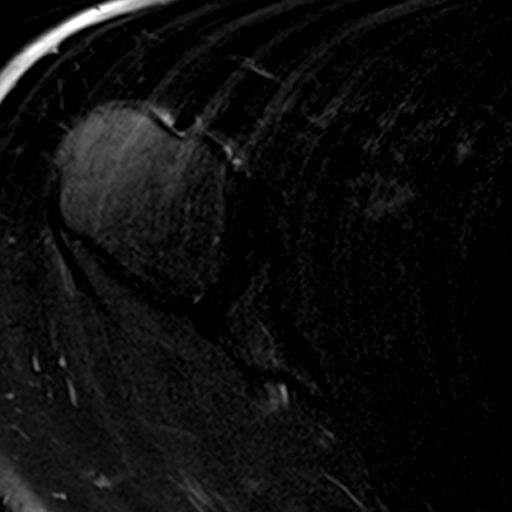
[im 12/24]
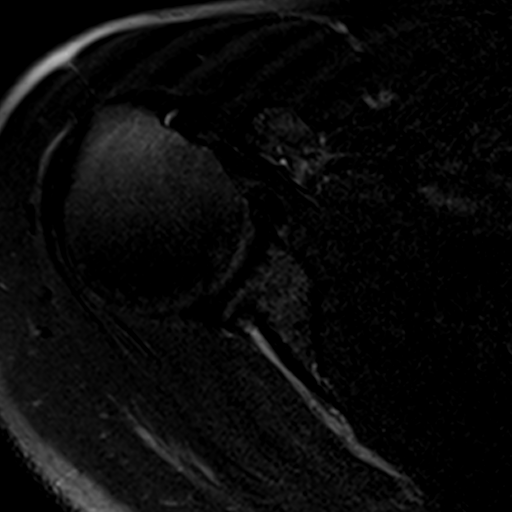
[im 24/24]
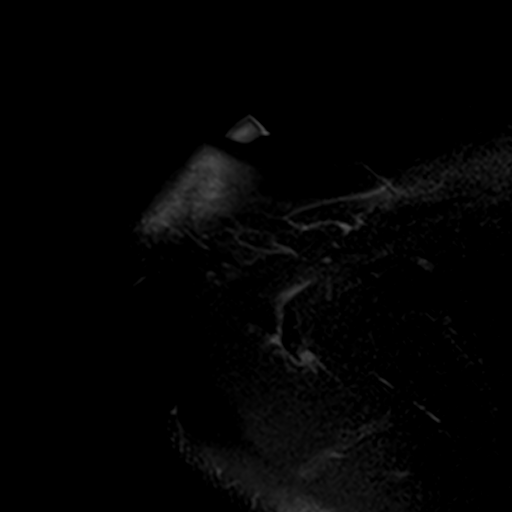

[Series 4: t2fs coronal · oblique · 3.0mm · 0.26mm/px · 3 of 20 slices shown]
[im 1/20]
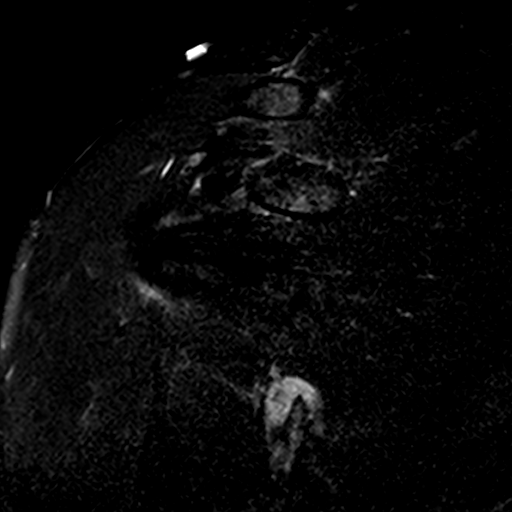
[im 10/20]
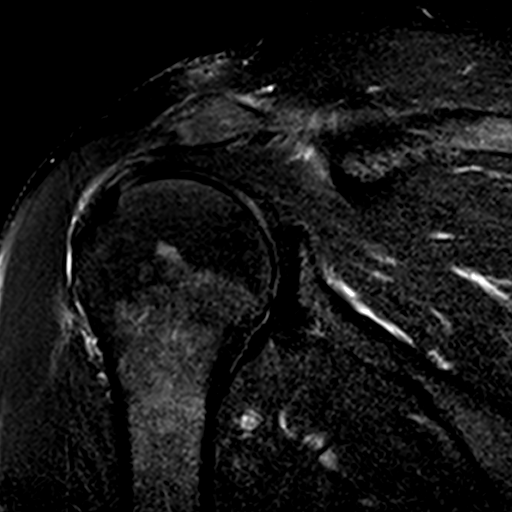
[im 20/20]
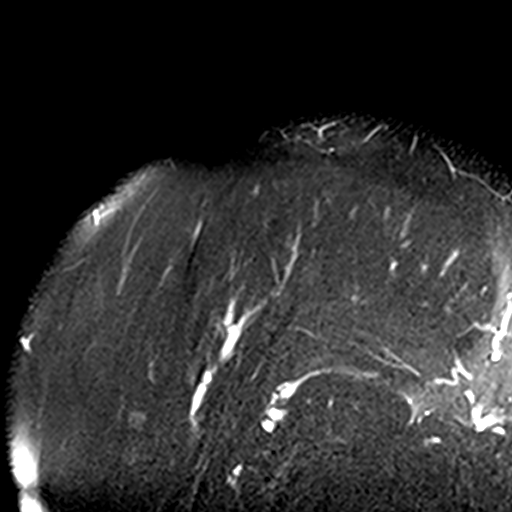

[Series 5: PD · oblique · 3.0mm · 0.24mm/px · 5 of 20 slices shown]
[im 1/20]
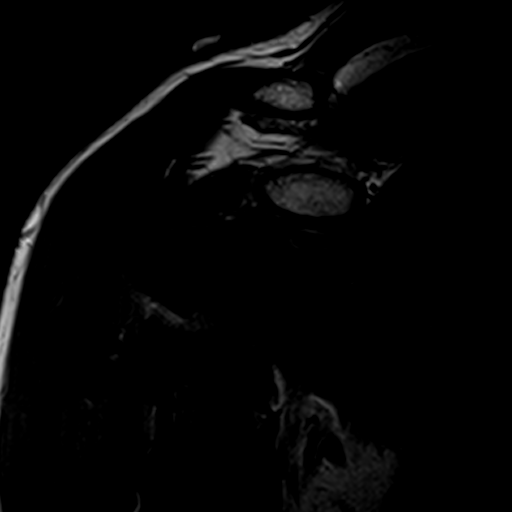
[im 5/20]
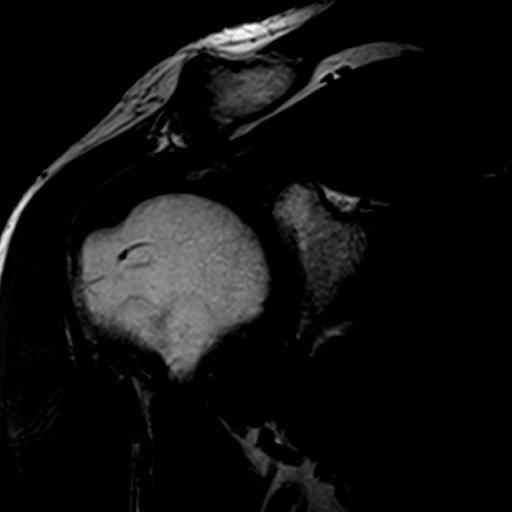
[im 10/20]
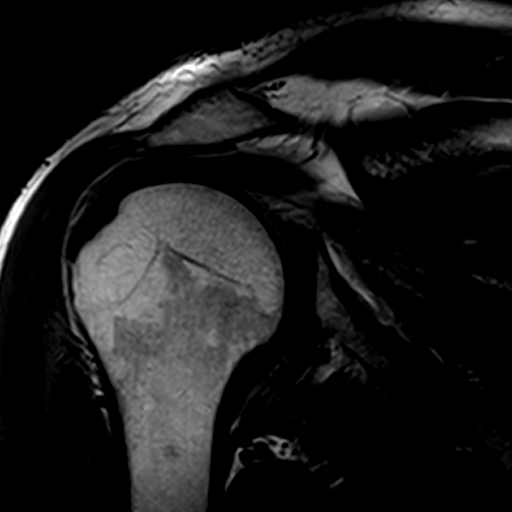
[im 15/20]
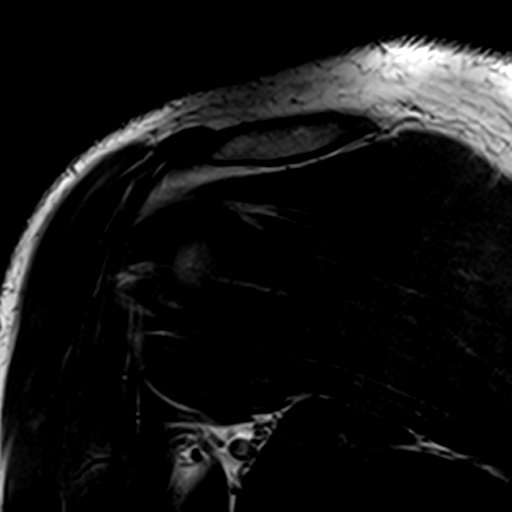
[im 20/20]
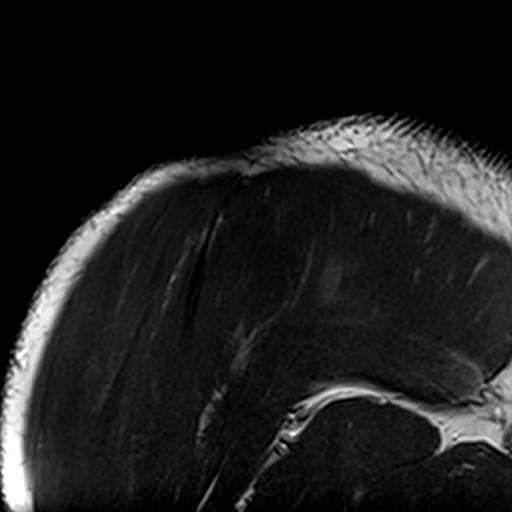

[Series 6: T1 · oblique · 3.0mm · 0.22mm/px · 7 of 28 slices shown]
[im 1/28]
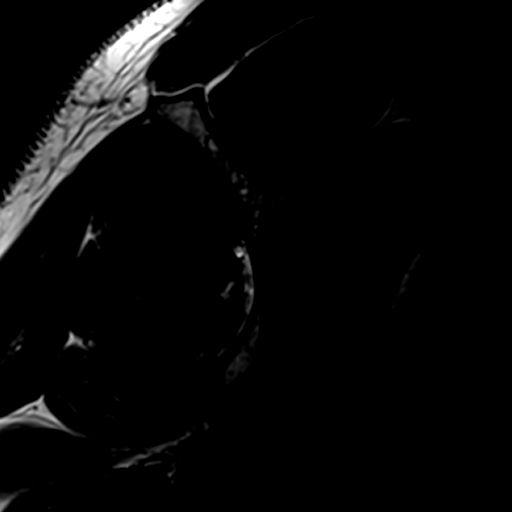
[im 5/28]
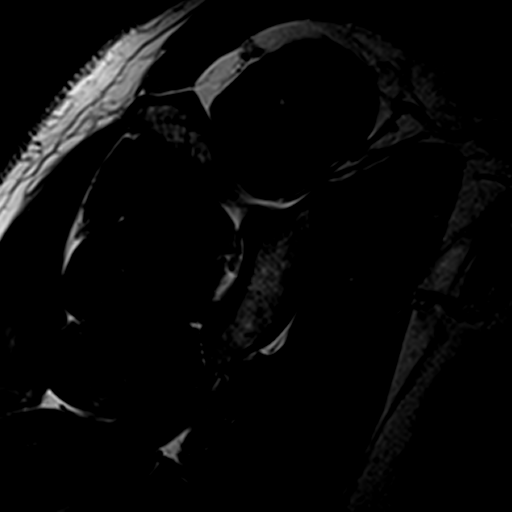
[im 10/28]
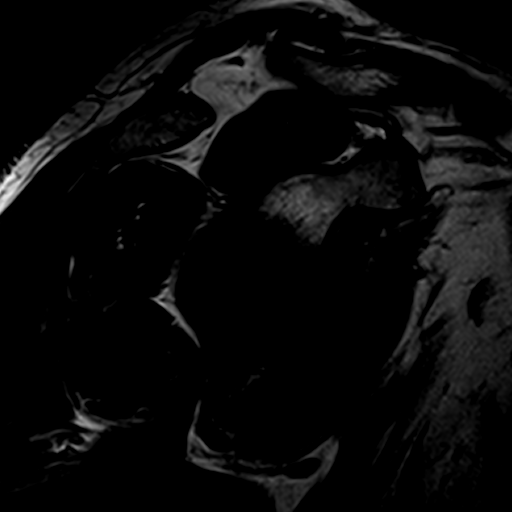
[im 14/28]
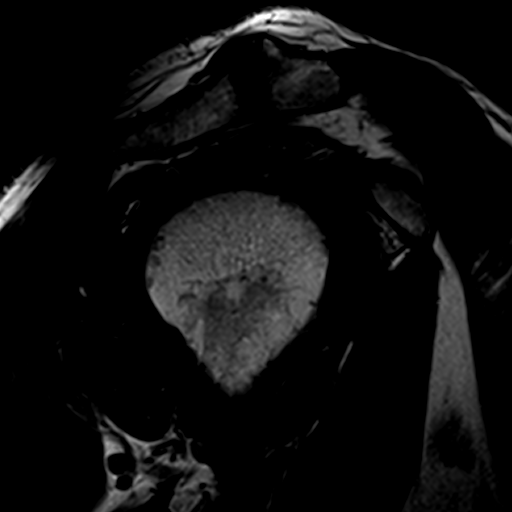
[im 19/28]
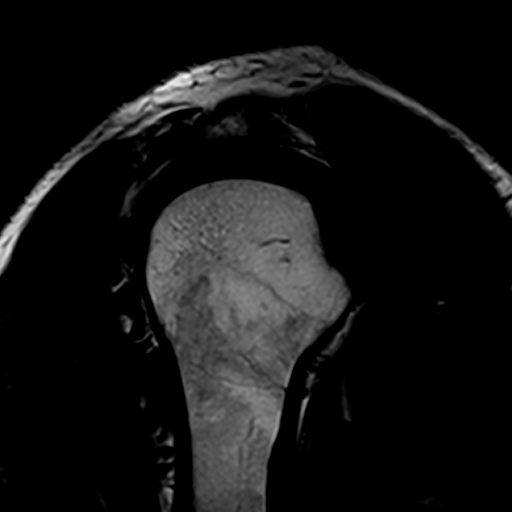
[im 23/28]
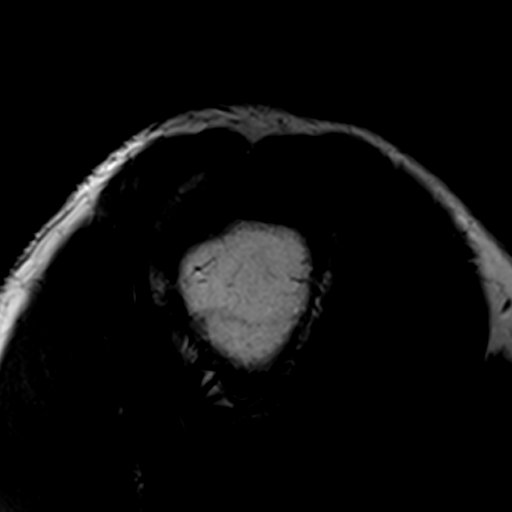
[im 28/28]
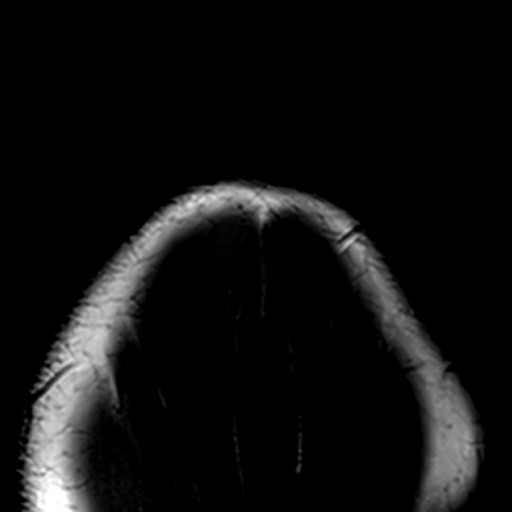

[19 of 40 positions shown; findings below may reference images not displayed]

FINDINGS: Rotator cuff: There is rotator cuff tendinopathy with
heterogeneously increased T2 signal and mild thickening seen in the
supraspinatus, infraspinatus and subscapularis tendons. No tear is
identified.

Muscles:  Normal in appearance without atrophy or focal lesion.

Biceps long head:  Intact.

Acromioclavicular Joint: Moderate to moderately severe
acromioclavicular osteoarthritis appears advanced for age with bony
hypertrophy, marrow edema and subchondral cyst formation about the
joint.

Glenohumeral Joint: Unremarkable.

Labrum: Degenerative signal is seen in the superior labrum. A cyst
off the inferior labrum at the 6 o'clock position measures 0.3 cm
transverse by 0.6 cm AP by 0.4 cm craniocaudal and is compatible
with a paralabral cyst.

Bones: The acromion is type 1. There is no fracture or worrisome
marrow lesion.
IMPRESSION: Rotator cuff tendinopathy without tear.

Moderate to moderately severe acromioclavicular osteoarthritis
appears advanced for age.

Small cyst off the inferior labrum at the 6 o'clock position is
consistent with a paralabral cyst indicative of a labral tear. No
tear is visible on this examination.

## 2017-01-30 ENCOUNTER — Other Ambulatory Visit: Payer: Self-pay | Admitting: Family Medicine

## 2017-01-30 DIAGNOSIS — M25521 Pain in right elbow: Secondary | ICD-10-CM

## 2018-09-27 DIAGNOSIS — N529 Male erectile dysfunction, unspecified: Secondary | ICD-10-CM

## 2018-09-27 HISTORY — DX: Male erectile dysfunction, unspecified: N52.9

## 2018-10-11 ENCOUNTER — Other Ambulatory Visit: Payer: Self-pay

## 2018-10-12 ENCOUNTER — Ambulatory Visit (INDEPENDENT_AMBULATORY_CARE_PROVIDER_SITE_OTHER): Payer: BC Managed Care – PPO | Admitting: Family Medicine

## 2018-10-12 ENCOUNTER — Encounter: Payer: Self-pay | Admitting: Family Medicine

## 2018-10-12 VITALS — BP 130/82 | HR 75 | Temp 98.0°F | Ht 67.0 in | Wt 175.0 lb

## 2018-10-12 DIAGNOSIS — Z0001 Encounter for general adult medical examination with abnormal findings: Secondary | ICD-10-CM | POA: Diagnosis not present

## 2018-10-12 DIAGNOSIS — N529 Male erectile dysfunction, unspecified: Secondary | ICD-10-CM | POA: Diagnosis not present

## 2018-10-12 DIAGNOSIS — H6121 Impacted cerumen, right ear: Secondary | ICD-10-CM

## 2018-10-12 DIAGNOSIS — K219 Gastro-esophageal reflux disease without esophagitis: Secondary | ICD-10-CM

## 2018-10-12 DIAGNOSIS — Z0000 Encounter for general adult medical examination without abnormal findings: Secondary | ICD-10-CM

## 2018-10-12 DIAGNOSIS — E559 Vitamin D deficiency, unspecified: Secondary | ICD-10-CM | POA: Insufficient documentation

## 2018-10-12 DIAGNOSIS — R5383 Other fatigue: Secondary | ICD-10-CM | POA: Diagnosis not present

## 2018-10-12 HISTORY — DX: Vitamin D deficiency, unspecified: E55.9

## 2018-10-12 NOTE — Progress Notes (Signed)
Subjective:  Patient ID: Gregory Charles., male    DOB: 01-06-1966  Age: 54 y.o. MRN: [id number]  Patient Care Team: Loman Brooklyn, FNP as PCP - General (Family Medicine)   CC:  Chief Complaint  Patient presents with  . Annual Exam    HPI Gregory H Hassel Neth. presents for his annual physical.   Substance use: Drinks daily Diet: very high in salt Last dental exam: regular visits Last colonoscopy: never; waiting until 53 years of age PSA: discussed and patient declined Immunizations needed: Tdap Vaccine: UTD  Zoster Vaccine: dicussed and declined  Patient is concerned today that he has been having intimacy issues for the past 6 months. He does find himself attracted to his partner. He does not smoke or use any illegal drugs but does drink daily. He states he has always drank and does not feel this has changed so therefore there is no reason for it to recently start effecting his sex life. He denies depression or anxiety. He does feel fatigued but thinks this may be due to the fact that he works out in the heat every day.   He is also concerned that every time he eats hamburger he experiences abdominal pain. He denies nausea or vomiting. He does have diarrhea at times but states that happens any time he drinks a mountain dew, which he does several times a day. He does have a history of GERD and does not take any medications to treat it. His father had pancreatic cancer which worries him. The pain occurs immediately, sometimes while he is still eating. There is no associated rash. No recent tick bites. Does not happen with any other food groups that he is aware of.    Review of Systems  Constitutional: Positive for malaise/fatigue. Negative for chills, fever and weight loss.  HENT: Negative for congestion, ear discharge, ear pain, nosebleeds, sinus pain, sore throat and tinnitus.   Eyes: Negative for blurred vision, double vision, pain, discharge and redness.  Respiratory:  Negative for cough, shortness of breath and wheezing.   Cardiovascular: Negative for chest pain, palpitations and leg swelling.  Gastrointestinal: Positive for abdominal pain (epigastric) and heartburn. Negative for blood in stool, constipation, diarrhea, nausea and vomiting.  Genitourinary: Negative for dysuria, frequency and urgency.       Denies trouble initiating a urine stream, weak stream, split stream, and dribbling.   Musculoskeletal: Positive for back pain (improving). Negative for myalgias.  Skin: Negative for rash.  Neurological: Negative for dizziness, seizures, weakness and headaches.  Psychiatric/Behavioral: Negative for depression, substance abuse and suicidal ideas. The patient is not nervous/anxious.    Past Medical History:  Diagnosis Date  . GERD (gastroesophageal reflux disease)    History reviewed. No pertinent surgical history.  Family History  Problem Relation Age of Onset  . Healthy Mother   . Cancer Father   . Pancreatic cancer Father   . Diabetes Maternal Grandmother    Social History   Socioeconomic History  . Marital status: Single    Spouse name: Not on file  . Number of children: Not on file  . Years of education: Not on file  . Highest education level: Not on file  Occupational History  . Not on file  Social Needs  . Financial resource strain: Not on file  . Food insecurity    Worry: Not on file    Inability: Not on file  . Transportation needs    Medical: Not on file  Non-medical: Not on file  Tobacco Use  . Smoking status: Former Smoker    Types: Cigarettes    Quit date: 10/11/2009    Years since quitting: 9.0  . Smokeless tobacco: Never Used  Substance and Sexual Activity  . Alcohol use: Yes    Alcohol/week: 5.0 standard drinks    Types: 5 Cans of beer per week  . Drug use: No  . Sexual activity: Not on file  Lifestyle  . Physical activity    Days per week: Not on file    Minutes per session: Not on file  . Stress: Not on  file  Relationships  . Social Herbalist on phone: Not on file    Gets together: Not on file    Attends religious service: Not on file    Active member of club or organization: Not on file    Attends meetings of clubs or organizations: Not on file    Relationship status: Not on file  . Intimate partner violence    Fear of current or ex partner: Not on file    Emotionally abused: Not on file    Physically abused: Not on file    Forced sexual activity: Not on file  Other Topics Concern  . Not on file  Social History Narrative  . Not on file   No current outpatient medications on file.  No Known Allergies    Objective:    BP 130/82   Pulse 75   Temp 98 F (36.7 C) (Oral)   Ht '5\' 7"'  (1.702 m)   Wt 175 lb (79.4 kg)   BMI 27.41 kg/m  Wt Readings from Last 3 Encounters:  10/12/18 175 lb (79.4 kg)  01/03/17 176 lb 9.6 oz (80.1 kg)  02/03/16 179 lb (81.2 kg)   Physical Exam Vitals signs reviewed.  Constitutional:      General: He is not in acute distress.    Appearance: Normal appearance. He is overweight. He is not ill-appearing, toxic-appearing or diaphoretic.  HENT:     Head: Normocephalic and atraumatic.     Right Ear: Tympanic membrane, ear canal and external ear normal. There is impacted cerumen.     Left Ear: Tympanic membrane, ear canal and external ear normal. There is no impacted cerumen.     Nose: Nose normal. No congestion or rhinorrhea.     Mouth/Throat:     Mouth: Mucous membranes are moist.     Pharynx: Oropharynx is clear. No oropharyngeal exudate or posterior oropharyngeal erythema.  Eyes:     General: No scleral icterus.       Right eye: No discharge.        Left eye: No discharge.     Conjunctiva/sclera: Conjunctivae normal.     Pupils: Pupils are equal, round, and reactive to light.  Neck:     Musculoskeletal: Normal range of motion and neck supple. No neck rigidity or muscular tenderness.  Cardiovascular:     Rate and Rhythm: Normal  rate and regular rhythm.     Heart sounds: Normal heart sounds. No murmur. No friction rub. No gallop.   Pulmonary:     Effort: Pulmonary effort is normal. No respiratory distress.     Breath sounds: Normal breath sounds. No stridor. No wheezing, rhonchi or rales.  Abdominal:     General: Abdomen is flat. Bowel sounds are normal. There is no distension.     Palpations: Abdomen is soft. There is no hepatomegaly, splenomegaly or mass.  Tenderness: There is abdominal tenderness in the epigastric area. There is no guarding or rebound. Negative signs include Murphy's sign.     Hernia: No hernia is present.  Musculoskeletal: Normal range of motion.     Right lower leg: No edema.     Left lower leg: No edema.  Lymphadenopathy:     Cervical: No cervical adenopathy.  Skin:    General: Skin is warm and dry.     Capillary Refill: Capillary refill takes less than 2 seconds.  Neurological:     General: No focal deficit present.     Mental Status: He is alert and oriented to person, place, and time. Mental status is at baseline.  Psychiatric:        Mood and Affect: Mood normal.        Behavior: Behavior normal.        Thought Content: Thought content normal.        Judgment: Judgment normal.     Lab Results  Component Value Date   TSH 1.420 10/12/2018   Lab Results  Component Value Date   WBC 6.4 10/12/2018   HGB 15.8 10/12/2018   HCT 44.7 10/12/2018   MCV 96 10/12/2018   PLT 255 10/12/2018   Lab Results  Component Value Date   NA 140 10/12/2018   K 4.1 10/12/2018   CO2 22 10/12/2018   GLUCOSE 93 10/12/2018   BUN 13 10/12/2018   CREATININE 1.37 (H) 10/12/2018   BILITOT 0.5 10/12/2018   ALKPHOS 95 10/12/2018   AST 22 10/12/2018   ALT 23 10/12/2018   PROT 6.5 10/12/2018   ALBUMIN 4.4 10/12/2018   CALCIUM 9.1 10/12/2018   Lab Results  Component Value Date   CHOL 217 (H) 10/12/2018   Lab Results  Component Value Date   HDL 43 10/12/2018   Lab Results  Component  Value Date   LDLCALC 112 (H) 10/12/2018   Lab Results  Component Value Date   TRIG 310 (H) 10/12/2018   Lab Results  Component Value Date   CHOLHDL 5.0 10/12/2018     Assessment & Plan:   1. Well adult exam - Declines colonoscopy at this time; we did discuss benefits of early detection and alternatives. Also declines Shingrix. Education provided on preventive care. - CBC with Differential/Platelet - CMP14+EGFR - Lipid panel (patient has not eaten in 6.5 hours) - TSH  2. Fatigue, unspecified type - CBC with Differential/Platelet - VITAMIN D 25 Hydroxy (Vit-D Deficiency, Fractures) - TSH - Testosterone,Free and Total - Prolactin  3. Erectile dysfunction, unspecified erectile dysfunction type - Labs today, if all are normal will prescribe sildenafil for a trial. Education provided on erectile dysfunction.  - CBC with Differential/Platelet - CMP14+EGFR - VITAMIN D 25 Hydroxy (Vit-D Deficiency, Fractures) - TSH - Testosterone,Free and Total - Prolactin  4. Impacted cerumen of right ear - Encouraged to purchase Debrox wax removal kit over the counter. Drops (3-4) should be instilled twice daily x3 days, then the ear flushed with warm water on the 4th day.   5. Gastroesophageal reflux disease, esophagitis presence not specified - Encouraged to try OTC famotidine BID. Education provided on GERD.    Follow-up: Return in about 1 year (around 10/12/2019) for annual physical.   Hendricks Limes, MSN, APRN, FNP-[facility] [place]

## 2018-10-12 NOTE — Patient Instructions (Signed)
 Gastroesophageal Reflux Disease, Adult Gastroesophageal reflux (GER) happens when acid from the stomach flows up into the tube that connects the mouth and the stomach (esophagus). Normally, food travels down the esophagus and stays in the stomach to be digested. With GER, food and stomach acid sometimes move back up into the esophagus. You may have a disease called gastroesophageal reflux disease (GERD) if the reflux:  Happens often.  Causes frequent or very bad symptoms.  Causes problems such as damage to the esophagus. When this happens, the esophagus becomes sore and swollen (inflamed). Over time, GERD can make small holes (ulcers) in the lining of the esophagus. What are the causes? This condition is caused by a problem with the muscle between the esophagus and the stomach. When this muscle is weak or not normal, it does not close properly to keep food and acid from coming back up from the stomach. The muscle can be weak because of:  Tobacco use.  Pregnancy.  Having a certain type of hernia (hiatal hernia).  Alcohol use.  Certain foods and drinks, such as coffee, chocolate, onions, and peppermint. What increases the risk? You are more likely to develop this condition if you:  Are overweight.  Have a disease that affects your connective tissue.  Use NSAID medicines. What are the signs or symptoms? Symptoms of this condition include:  Heartburn.  Difficult or painful swallowing.  The feeling of having a lump in the throat.  A bitter taste in the mouth.  Bad breath.  Having a lot of saliva.  Having an upset or bloated stomach.  Belching.  Chest pain. Different conditions can cause chest pain. Make sure you see your doctor if you have chest pain.  Shortness of breath or noisy breathing (wheezing).  Ongoing (chronic) cough or a cough at night.  Wearing away of the surface of teeth (tooth enamel).  Weight loss. How is this treated? Treatment will depend on  how bad your symptoms are. Your doctor may suggest:  Changes to your diet.  Medicine.  Surgery. Follow these instructions at home: Eating and drinking   Follow a diet as told by your doctor. You may need to avoid foods and drinks such as: ? Coffee and tea (with or without caffeine). ? Drinks that contain alcohol. ? Energy drinks and sports drinks. ? Bubbly (carbonated) drinks or sodas. ? Chocolate and cocoa. ? Peppermint and mint flavorings. ? Garlic and onions. ? Horseradish. ? Spicy and acidic foods. These include peppers, chili powder, curry powder, vinegar, hot sauces, and BBQ sauce. ? Citrus fruit juices and citrus fruits, such as oranges, lemons, and limes. ? Tomato-based foods. These include red sauce, chili, salsa, and pizza with red sauce. ? Fried and fatty foods. These include donuts, french fries, potato chips, and high-fat dressings. ? High-fat meats. These include hot dogs, rib eye steak, sausage, ham, and bacon. ? High-fat dairy items, such as whole milk, butter, and cream cheese.  Eat small meals often. Avoid eating large meals.  Avoid drinking large amounts of liquid with your meals.  Avoid eating meals during the 2-3 hours before bedtime.  Avoid lying down right after you eat.  Do not exercise right after you eat. Lifestyle   Do not use any products that contain nicotine or tobacco. These include cigarettes, e-cigarettes, and chewing tobacco. If you need help quitting, ask your doctor.  Try to lower your stress. If you need help doing this, ask your doctor.  If you are overweight, lose an   amount of weight that is healthy for you. Ask your doctor about a safe weight loss goal. General instructions  Pay attention to any changes in your symptoms.  Take over-the-counter and prescription medicines only as told by your doctor. Do not take aspirin, ibuprofen, or other NSAIDs unless your doctor says it is okay.  Wear loose clothes. Do not wear anything tight  around your waist.  Raise (elevate) the head of your bed about 6 inches (15 cm).  Avoid bending over if this makes your symptoms worse.  Keep all follow-up visits as told by your doctor. This is important. Contact a doctor if:  You have new symptoms.  You lose weight and you do not know why.  You have trouble swallowing or it hurts to swallow.  You have wheezing or a cough that keeps happening.  Your symptoms do not get better with treatment.  You have a hoarse voice. Get help right away if:  You have pain in your arms, neck, jaw, teeth, or back.  You feel sweaty, dizzy, or light-headed.  You have chest pain or shortness of breath.  You throw up (vomit) and your throw-up looks like blood or coffee grounds.  You pass out (faint).  Your poop (stool) is bloody or black.  You cannot swallow, drink, or eat. Summary  If a person has gastroesophageal reflux disease (GERD), food and stomach acid move back up into the esophagus and cause symptoms or problems such as damage to the esophagus.  Treatment will depend on how bad your symptoms are.  Follow a diet as told by your doctor.  Take all medicines only as told by your doctor. This information is not intended to replace advice given to you by your health care provider. Make sure you discuss any questions you have with your health care provider. Document Released: 09/01/2007 Document Revised: 09/21/2017 Document Reviewed: 09/21/2017 Elsevier Patient Education  2020 [employer]   Erectile Dysfunction Erectile dysfunction (ED) is the inability to get or keep an erection in order to have sexual intercourse. Erectile dysfunction may include:  Inability to get an erection.  Lack of enough hardness of the erection to allow penetration.  Loss of the erection before sex is finished. What are the causes? This condition may be caused by:  Certain medicines, such as: ? Pain relievers. ? Antihistamines. ?  Antidepressants. ? Blood pressure medicines. ? Water pills (diuretics). ? Ulcer medicines. ? Muscle relaxants. ? Drugs.  Excessive drinking.  Psychological causes, such as: ? Anxiety. ? Depression. ? Sadness. ? Exhaustion. ? Performance fear. ? Stress.  Physical causes, such as: ? Artery problems. This may include diabetes, smoking, liver disease, or atherosclerosis. ? High blood pressure. ? Hormonal problems, such as low testosterone. ? Obesity. ? Nerve problems. This may include back or pelvic injuries, diabetes mellitus, multiple sclerosis, or Parkinson disease. What are the signs or symptoms? Symptoms of this condition include:  Inability to get an erection.  Lack of enough hardness of the erection to allow penetration.  Loss of the erection before sex is finished.  Normal erections at some times, but with frequent unsatisfactory episodes.  Low sexual satisfaction in either partner due to erection problems.  A curved penis occurring with erection. The curve may cause pain or the penis may be too curved to allow for intercourse.  Never having nighttime erections. How is this diagnosed? This condition is often diagnosed by:  Performing a physical exam to find other diseases or specific problems with the   penis.  Asking you detailed questions about the problem.  Performing blood tests to check for diabetes mellitus or to measure hormone levels.  Performing other tests to check for underlying health conditions.  Performing an ultrasound exam to check for scarring.  Performing a test to check blood flow to the penis.  Doing a sleep study at home to measure nighttime erections. How is this treated? This condition may be treated by:  Medicine taken by mouth to help you achieve an erection (oral medicine).  Hormone replacement therapy to replace low testosterone levels.  Medicine that is injected into the penis. Your health care provider may instruct you how  to give yourself these injections at home.  Vacuum pump. This is a pump with a ring on it. The pump and ring are placed on the penis and used to create pressure that helps the penis become erect.  Penile implant surgery. In this procedure, you may receive: ? An inflatable implant. This consists of cylinders, a pump, and a reservoir. The cylinders can be inflated with a fluid that helps to create an erection, and they can be deflated after intercourse. ? A semi-rigid implant. This consists of two silicone rubber rods. The rods provide some rigidity. They are also flexible, so the penis can both curve downward in its normal position and become straight for sexual intercourse.  Blood vessel surgery, to improve blood flow to the penis. During this procedure, a blood vessel from a different part of the body is placed into the penis to allow blood to flow around (bypass) damaged or blocked blood vessels.  Lifestyle changes, such as exercising more, losing weight, and quitting smoking. Follow these instructions at home: Medicines   Take over-the-counter and prescription medicines only as told by your health care provider. Do not increase the dosage without first discussing it with your health care provider.  If you are using self-injections, perform injections as directed by your health care provider. Make sure to avoid any veins that are on the surface of the penis. After giving an injection, apply pressure to the injection site for 5 minutes. General instructions  Exercise regularly, as directed by your health care provider. Work with your health care provider to lose weight, if needed.  Do not use any products that contain nicotine or tobacco, such as cigarettes and e-cigarettes. If you need help quitting, ask your health care provider.  Before using a vacuum pump, read the instructions that come with the pump and discuss any questions with your health care provider.  Keep all follow-up visits  as told by your health care provider. This is important. Contact a health care provider if:  You feel nauseous.  You vomit. Get help right away if:  You are taking oral or injectable medicines and you have an erection that lasts longer than 4 hours. If your health care provider is unavailable, go to the nearest emergency room for evaluation. An erection that lasts much longer than 4 hours can result in permanent damage to your penis.  You have severe pain in your groin or abdomen.  You develop redness or severe swelling of your penis.  You have redness spreading up into your groin or lower abdomen.  You are unable to urinate.  You experience chest pain or a rapid heart beat (palpitations) after taking oral medicines. Summary  Erectile dysfunction (ED) is the inability to get or keep an erection during sexual intercourse. This problem can usually be treated successfully.  This condition  is diagnosed based on a physical exam, your symptoms, and tests to determine the cause. Treatment varies depending on the cause, and may include medicines, hormone therapy, surgery, or vacuum pump.  You may need follow-up visits to make sure that you are using your medicines or devices correctly.  Get help right away if you are taking or injecting medicines and you have an erection that lasts longer than 4 hours. This information is not intended to replace advice given to you by your health care provider. Make sure you discuss any questions you have with your health care provider. Document Released: 03/12/2000 Document Revised: 02/25/2017 Document Reviewed: 03/31/2016 Elsevier Patient Education  2020 [employer]   Preventive Care 84-38 Years Old, Male Preventive care refers to lifestyle choices and visits with your health care provider that can promote health and wellness. This includes:  A yearly physical exam. This is also called an annual well check.  Regular dental and eye exams.   Immunizations.  Screening for certain conditions.  Healthy lifestyle choices, such as eating a healthy diet, getting regular exercise, not using drugs or products that contain nicotine and tobacco, and limiting alcohol use. What can I expect for my preventive care visit? Physical exam Your health care provider will check:  Height and weight. These may be used to calculate body mass index (BMI), which is a measurement that tells if you are at a healthy weight.  Heart rate and blood pressure.  Your skin for abnormal spots. Counseling Your health care provider may ask you questions about:  Alcohol, tobacco, and drug use.  Emotional well-being.  Home and relationship well-being.  Sexual activity.  Eating habits.  Work and work Statistician. What immunizations do I need?  Influenza (flu) vaccine  This is recommended every year. Tetanus, diphtheria, and pertussis (Tdap) vaccine  You may need a Td booster every 10 years. Varicella (chickenpox) vaccine  You may need this vaccine if you have not already been vaccinated. Zoster (shingles) vaccine  You may need this after age 52. Measles, mumps, and rubella (MMR) vaccine  You may need at least one dose of MMR if you were born in 1957 or later. You may also need a second dose. Pneumococcal conjugate (PCV13) vaccine  You may need this if you have certain conditions and were not previously vaccinated. Pneumococcal polysaccharide (PPSV23) vaccine  You may need one or two doses if you smoke cigarettes or if you have certain conditions. Meningococcal conjugate (MenACWY) vaccine  You may need this if you have certain conditions. Hepatitis A vaccine  You may need this if you have certain conditions or if you travel or work in places where you may be exposed to hepatitis A. Hepatitis B vaccine  You may need this if you have certain conditions or if you travel or work in places where you may be exposed to hepatitis B.  Haemophilus influenzae type b (Hib) vaccine  You may need this if you have certain risk factors. Human papillomavirus (HPV) vaccine  If recommended by your health care provider, you may need three doses over 6 months. You may receive vaccines as individual doses or as more than one vaccine together in one shot (combination vaccines). Talk with your health care provider about the risks and benefits of combination vaccines. What tests do I need? Blood tests  Lipid and cholesterol levels. These may be checked every 5 years, or more frequently if you are over 64 years old.  Hepatitis C test.  Hepatitis B  test. Screening  Lung cancer screening. You may have this screening every year starting at age 69 if you have a 30-pack-year history of smoking and currently smoke or have quit within the past 15 years.  Prostate cancer screening. Recommendations will vary depending on your family history and other risks.  Colorectal cancer screening. All adults should have this screening starting at age 22 and continuing until age 40. Your health care provider may recommend screening at age 58 if you are at increased risk. You will have tests every 1-10 years, depending on your results and the type of screening test.  Diabetes screening. This is done by checking your blood sugar (glucose) after you have not eaten for a while (fasting). You may have this done every 1-3 years.  Sexually transmitted disease (STD) testing. Follow these instructions at home: Eating and drinking  Eat a diet that includes fresh fruits and vegetables, whole grains, lean protein, and low-fat dairy products.  Take vitamin and mineral supplements as recommended by your health care provider.  Do not drink alcohol if your health care provider tells you not to drink.  If you drink alcohol: ? Limit how much you have to 0-2 drinks a day. ? Be aware of how much alcohol is in your drink. In the [place] one drink equals one 12 oz  bottle of beer (355 mL), one 5 oz glass of wine (148 mL), or one 1 oz glass of hard liquor (44 mL). Lifestyle  Take daily care of your teeth and gums.  Stay active. Exercise for at least 30 minutes on 5 or more days each week.  Do not use any products that contain nicotine or tobacco, such as cigarettes, e-cigarettes, and chewing tobacco. If you need help quitting, ask your health care provider.  If you are sexually active, practice safe sex. Use a condom or other form of protection to prevent STIs (sexually transmitted infections).  Talk with your health care provider about taking a low-dose aspirin every day starting at age 29. What's next?  Go to your health care provider once a year for a well check visit.  Ask your health care provider how often you should have your eyes and teeth checked.  Stay up to date on all vaccines. This information is not intended to replace advice given to you by your health care provider. Make sure you discuss any questions you have with your health care provider. Document Released: 04/11/2015 Document Revised: 03/09/2018 Document Reviewed: 03/09/2018 Elsevier Patient Education  2020 [employer].

## 2018-10-13 ENCOUNTER — Encounter: Payer: Self-pay | Admitting: Family Medicine

## 2018-10-13 ENCOUNTER — Other Ambulatory Visit: Payer: Self-pay | Admitting: Family Medicine

## 2018-10-13 DIAGNOSIS — E782 Mixed hyperlipidemia: Secondary | ICD-10-CM | POA: Insufficient documentation

## 2018-10-13 DIAGNOSIS — N183 Chronic kidney disease, stage 3 unspecified: Secondary | ICD-10-CM | POA: Insufficient documentation

## 2018-10-13 DIAGNOSIS — N529 Male erectile dysfunction, unspecified: Secondary | ICD-10-CM

## 2018-10-13 DIAGNOSIS — K219 Gastro-esophageal reflux disease without esophagitis: Secondary | ICD-10-CM | POA: Insufficient documentation

## 2018-10-13 LAB — CMP14+EGFR
ALT: 23 IU/L (ref 0–44)
AST: 22 IU/L (ref 0–40)
Albumin/Globulin Ratio: 2.1 (ref 1.2–2.2)
Albumin: 4.4 g/dL (ref 3.8–4.9)
Alkaline Phosphatase: 95 IU/L (ref 39–117)
BUN/Creatinine Ratio: 9 (ref 9–20)
BUN: 13 mg/dL (ref 6–24)
Bilirubin Total: 0.5 mg/dL (ref 0.0–1.2)
CO2: 22 mmol/L (ref 20–29)
Calcium: 9.1 mg/dL (ref 8.7–10.2)
Chloride: 103 mmol/L (ref 96–106)
Creatinine, Ser: 1.37 mg/dL — ABNORMAL HIGH (ref 0.76–1.27)
GFR calc Af Amer: 69 mL/min/{1.73_m2} (ref >59)
GFR calc non Af Amer: 59 mL/min/{1.73_m2} — ABNORMAL LOW (ref >59)
Globulin, Total: 2.1 g/dL (ref 1.5–4.5)
Glucose: 93 mg/dL (ref 65–99)
Potassium: 4.1 mmol/L (ref 3.5–5.2)
Sodium: 140 mmol/L (ref 134–144)
Total Protein: 6.5 g/dL (ref 6.0–8.5)

## 2018-10-13 LAB — CBC WITH DIFFERENTIAL/PLATELET
Basophils Absolute: 0.1 10*3/uL (ref 0.0–0.2)
Basos: 1 %
EOS (ABSOLUTE): 0.4 10*3/uL (ref 0.0–0.4)
Eos: 7 %
Hematocrit: 44.7 % (ref 37.5–51.0)
Hemoglobin: 15.8 g/dL (ref 13.0–17.7)
Immature Grans (Abs): 0 10*3/uL (ref 0.0–0.1)
Immature Granulocytes: 0 %
Lymphocytes Absolute: 2 10*3/uL (ref 0.7–3.1)
Lymphs: 31 %
MCH: 34.1 pg — ABNORMAL HIGH (ref 26.6–33.0)
MCHC: 35.3 g/dL (ref 31.5–35.7)
MCV: 96 fL (ref 79–97)
Monocytes Absolute: 0.6 10*3/uL (ref 0.1–0.9)
Monocytes: 10 %
Neutrophils Absolute: 3.3 10*3/uL (ref 1.4–7.0)
Neutrophils: 51 %
Platelets: 255 10*3/uL (ref 150–450)
RBC: 4.64 x10E6/uL (ref 4.14–5.80)
RDW: 13.2 % (ref 11.6–15.4)
WBC: 6.4 10*3/uL (ref 3.4–10.8)

## 2018-10-13 LAB — LIPID PANEL
Chol/HDL Ratio: 5 ratio (ref 0.0–5.0)
Cholesterol, Total: 217 mg/dL — ABNORMAL HIGH (ref 100–199)
HDL: 43 mg/dL (ref >39)
LDL Calculated: 112 mg/dL — ABNORMAL HIGH (ref 0–99)
Triglycerides: 310 mg/dL — ABNORMAL HIGH (ref 0–149)
VLDL Cholesterol Cal: 62 mg/dL — ABNORMAL HIGH (ref 5–40)

## 2018-10-13 LAB — PROLACTIN: Prolactin: 13.6 ng/mL (ref 4.0–15.2)

## 2018-10-13 LAB — TESTOSTERONE,FREE AND TOTAL
Testosterone, Free: 10.6 pg/mL (ref 7.2–24.0)
Testosterone: 352 ng/dL (ref 264–916)

## 2018-10-13 LAB — TSH: TSH: 1.42 u[IU]/mL (ref 0.450–4.500)

## 2018-10-13 LAB — VITAMIN D 25 HYDROXY (VIT D DEFICIENCY, FRACTURES): Vit D, 25-Hydroxy: 28.9 ng/mL — ABNORMAL LOW (ref 30.0–100.0)

## 2018-10-13 MED ORDER — SILDENAFIL CITRATE 20 MG PO TABS: 20.0000 mg | ORAL_TABLET | Freq: Every day | ORAL | 0 refills | Status: DC | PRN

## 2018-11-27 ENCOUNTER — Other Ambulatory Visit: Payer: Self-pay | Admitting: Family Medicine

## 2018-11-27 DIAGNOSIS — N529 Male erectile dysfunction, unspecified: Secondary | ICD-10-CM

## 2018-11-28 MED ORDER — SILDENAFIL CITRATE 20 MG PO TABS: 20.0000 mg | ORAL_TABLET | Freq: Every day | ORAL | 0 refills | Status: DC | PRN

## 2018-11-29 ENCOUNTER — Telehealth: Payer: Self-pay | Admitting: *Deleted

## 2018-11-29 DIAGNOSIS — N529 Male erectile dysfunction, unspecified: Secondary | ICD-10-CM

## 2018-11-29 MED ORDER — SILDENAFIL CITRATE 50 MG PO TABS: 50.0000 mg | ORAL_TABLET | Freq: Every day | ORAL | 2 refills | Status: AC | PRN

## 2018-11-29 NOTE — Telephone Encounter (Signed)
Fax from [place] to Sildenafil 20 mg Pharmacy note: Pt would like to increase to 50 mg Please advise and send new Rx

## 2018-11-29 NOTE — Addendum Note (Signed)
Addended by: Hendricks Limes F on: 11/29/2018 01:13 PM   Modules accepted: Orders

## 2019-04-13 ENCOUNTER — Other Ambulatory Visit: Payer: Self-pay

## 2019-04-16 ENCOUNTER — Other Ambulatory Visit: Payer: Self-pay

## 2019-04-16 ENCOUNTER — Ambulatory Visit (INDEPENDENT_AMBULATORY_CARE_PROVIDER_SITE_OTHER): Payer: BC Managed Care – PPO

## 2019-04-16 ENCOUNTER — Encounter: Payer: Self-pay | Admitting: Family Medicine

## 2019-04-16 ENCOUNTER — Ambulatory Visit (INDEPENDENT_AMBULATORY_CARE_PROVIDER_SITE_OTHER): Payer: BC Managed Care – PPO | Admitting: Family Medicine

## 2019-04-16 VITALS — BP 132/77 | HR 82 | Temp 99.0°F | Resp 20 | Ht 67.0 in | Wt 177.0 lb

## 2019-04-16 DIAGNOSIS — M25522 Pain in left elbow: Secondary | ICD-10-CM

## 2019-04-16 DIAGNOSIS — M25532 Pain in left wrist: Secondary | ICD-10-CM

## 2019-04-16 DIAGNOSIS — W19XXXA Unspecified fall, initial encounter: Secondary | ICD-10-CM

## 2019-04-16 DIAGNOSIS — M5410 Radiculopathy, site unspecified: Secondary | ICD-10-CM

## 2019-04-16 MED ORDER — PREDNISONE 20 MG PO TABS: ORAL_TABLET | ORAL | 0 refills | Status: AC

## 2019-04-16 NOTE — Progress Notes (Signed)
Subjective:  Patient ID: Gregory Haggis., male    DOB: 05/25/1965, 54 y.o.   MRN: [id number]  Patient Care Team: Loman Brooklyn, FNP as PCP - General (Family Medicine)   Chief Complaint:  Fall (left wrist and left elbow pain ( 2 mos ago))   HPI: Gregory TIEDT. is a 54 y.o. male presenting on 04/16/2019 for Fall (left wrist and left elbow pain ( 2 mos ago))   Pt states he fell from his toolbox about 2 months ago. Pt he was stepping off of his toolbox onto a stool and missed falling to the floor. States this was about 3 feet off of the ground. States he landed on his buttocks. States his left elbow then hit the floor followed by his left wrist. He did not catch himself with an outstretched hand. He states he has continued pain with tingling shooting from his wrist to his elbow. He is moving his arm freely during the exam while explaining and reenacting his injury. He has been taking Tylenol and Aleve for the pain without relief.   Fall The accident occurred more than 1 week ago. Fall occurred: from a toolbox. He fell from a height of 3 to 5 ft. He landed on concrete. There was no blood loss. The point of impact was the buttocks, left elbow and left wrist. The pain is present in the left wrist and left elbow. The pain is at a severity of 5/10. The pain is moderate. The symptoms are aggravated by use of injured limb and movement. Associated symptoms include tingling. Pertinent negatives include no abdominal pain, bowel incontinence, fever, headaches, hearing loss, hematuria, loss of consciousness, nausea, numbness, visual change or vomiting. He has tried acetaminophen and NSAID for the symptoms. The treatment provided no relief.     Relevant past medical, surgical, family, and social history reviewed and updated as indicated.  Allergies and medications reviewed and updated. Date reviewed: Chart in Epic.   Past Medical History:  Diagnosis Date  . CKD (chronic kidney disease)  stage 3, GFR 30-59 ml/min   . Erectile dysfunction 09/2018  . GERD (gastroesophageal reflux disease)   . Mixed hyperlipidemia   . Vitamin D insufficiency 10/12/2018    History reviewed. No pertinent surgical history.  Social History   Socioeconomic History  . Marital status: Single    Spouse name: Not on file  . Number of children: Not on file  . Years of education: Not on file  . Highest education level: Not on file  Occupational History  . Not on file  Tobacco Use  . Smoking status: Former Smoker    Types: Cigarettes    Quit date: 10/11/2009    Years since quitting: 9.5  . Smokeless tobacco: Never Used  Substance and Sexual Activity  . Alcohol use: Yes    Alcohol/week: 5.0 standard drinks    Types: 5 Cans of beer per week  . Drug use: No  . Sexual activity: Not on file  Other Topics Concern  . Not on file  Social History Narrative  . Not on file   Social Determinants of Health   Financial Resource Strain:   . Difficulty of Paying Living Expenses: Not on file  Food Insecurity:   . Worried About Charity fundraiser in the Last Year: Not on file  . Ran Out of Food in the Last Year: Not on file  Transportation Needs:   . Lack of Transportation (Medical): Not  on file  . Lack of Transportation (Non-Medical): Not on file  Physical Activity:   . Days of Exercise per Week: Not on file  . Minutes of Exercise per Session: Not on file  Stress:   . Feeling of Stress : Not on file  Social Connections:   . Frequency of Communication with Friends and Family: Not on file  . Frequency of Social Gatherings with Friends and Family: Not on file  . Attends Religious Services: Not on file  . Active Member of Clubs or Organizations: Not on file  . Attends Archivist Meetings: Not on file  . Marital Status: Not on file  Intimate Partner Violence:   . Fear of Current or Ex-Partner: Not on file  . Emotionally Abused: Not on file  . Physically Abused: Not on file  .  Sexually Abused: Not on file    Outpatient Encounter Medications as of 04/16/2019  Medication Sig  . sildenafil (VIAGRA) 50 MG tablet Take 1-2 tablets (50-100 mg total) by mouth daily as needed for erectile dysfunction.  . predniSONE (DELTASONE) 20 MG tablet 2 po at sametime daily for 5 days   No facility-administered encounter medications on file as of 04/16/2019.    No Known Allergies  Review of Systems  Constitutional: Negative for activity change, appetite change, chills, diaphoresis, fatigue, fever and unexpected weight change.  HENT: Negative.   Eyes: Negative.  Negative for photophobia and visual disturbance.  Respiratory: Negative for cough, chest tightness and shortness of breath.   Cardiovascular: Negative for chest pain, palpitations and leg swelling.  Gastrointestinal: Negative for abdominal pain, blood in stool, bowel incontinence, constipation, diarrhea, nausea and vomiting.  Endocrine: Negative.   Genitourinary: Negative for decreased urine volume, difficulty urinating, dysuria, frequency, hematuria and urgency.  Musculoskeletal: Positive for arthralgias and myalgias.  Skin: Negative.   Allergic/Immunologic: Negative.   Neurological: Positive for tingling. Negative for dizziness, tremors, seizures, loss of consciousness, syncope, facial asymmetry, speech difficulty, weakness, light-headedness, numbness and headaches.  Hematological: Negative.   Psychiatric/Behavioral: Negative for confusion, hallucinations, sleep disturbance and suicidal ideas.  All other systems reviewed and are negative.       Objective:  BP 132/77   Pulse 82   Temp 99 F (37.2 C)   Resp 20   Ht _0  (1.702 m)   Wt 177 lb (80.3 kg)   SpO2 98%   BMI 27.72 kg/m    Wt Readings from Last 3 Encounters:  04/16/19 177 lb (80.3 kg)  10/12/18 175 lb (79.4 kg)  01/03/17 176 lb 9.6 oz (80.1 kg)    Physical Exam Vitals and nursing note reviewed.  Constitutional:      General: He is not in  acute distress.    Appearance: Normal appearance. He is well-developed and well-groomed. He is not ill-appearing, toxic-appearing or diaphoretic.  HENT:     Head: Normocephalic and atraumatic.     Jaw: There is normal jaw occlusion.     Right Ear: Hearing normal.     Left Ear: Hearing normal.     Nose: Nose normal.     Mouth/Throat:     Lips: Pink.     Mouth: Mucous membranes are moist.     Pharynx: Oropharynx is clear. Uvula midline.  Eyes:     General: Lids are normal.     Extraocular Movements: Extraocular movements intact.     Conjunctiva/sclera: Conjunctivae normal.     Pupils: Pupils are equal, round, and reactive to light.  Neck:  Thyroid: No thyroid mass, thyromegaly or thyroid tenderness.     Vascular: No carotid bruit or JVD.     Trachea: Trachea and phonation normal.  Cardiovascular:     Rate and Rhythm: Normal rate and regular rhythm.     Chest Wall: PMI is not displaced.     Pulses: Normal pulses.     Heart sounds: Normal heart sounds. No murmur. No friction rub. No gallop.   Pulmonary:     Effort: Pulmonary effort is normal. No respiratory distress.     Breath sounds: Normal breath sounds. No wheezing.  Abdominal:     General: Bowel sounds are normal. There is no distension or abdominal bruit.     Palpations: Abdomen is soft. There is no hepatomegaly or splenomegaly.     Tenderness: There is no abdominal tenderness. There is no right CVA tenderness or left CVA tenderness.     Hernia: No hernia is present.  Musculoskeletal:        General: Tenderness present. No swelling or deformity. Normal range of motion.     Left shoulder: Normal.     Left upper arm: Normal.     Left elbow: No swelling, deformity, effusion or lacerations. Normal range of motion. Tenderness present in olecranon process.     Left forearm: No swelling, edema, deformity, lacerations, tenderness or bony tenderness.     Left wrist: Tenderness present. No swelling, deformity, effusion,  lacerations, bony tenderness, snuff box tenderness or crepitus. Normal range of motion. Normal pulse.     Left hand: Normal.     Cervical back: Normal range of motion and neck supple.     Right lower leg: No edema.     Left lower leg: No edema.  Lymphadenopathy:     Cervical: No cervical adenopathy.  Skin:    General: Skin is warm and dry.     Capillary Refill: Capillary refill takes less than 2 seconds.     Coloration: Skin is not cyanotic, jaundiced or pale.     Findings: No rash.  Neurological:     General: No focal deficit present.     Mental Status: He is alert and oriented to person, place, and time.     Cranial Nerves: Cranial nerves are intact. No cranial nerve deficit.     Sensory: Sensation is intact. No sensory deficit.     Motor: Motor function is intact. No weakness.     Coordination: Coordination is intact. Coordination normal.     Gait: Gait is intact. Gait normal.     Deep Tendon Reflexes: Reflexes are normal and symmetric. Reflexes normal.  Psychiatric:        Attention and Perception: Attention and perception normal.        Mood and Affect: Mood and affect normal.        Speech: Speech normal.        Behavior: Behavior normal. Behavior is cooperative.        Thought Content: Thought content normal.        Cognition and Memory: Cognition and memory normal.        Judgment: Judgment normal.     Results for orders placed or performed in visit on 10/12/18  CBC with Differential/Platelet  Result Value Ref Range   WBC 6.4 3.4 - 10.8 x10E3/uL   RBC 4.64 4.14 - 5.80 x10E6/uL   Hemoglobin 15.8 13.0 - 17.7 g/dL   Hematocrit 44.7 37.5 - 51.0 %   MCV 96 79 - 97 fL  MCH 34.1 (H) 26.6 - 33.0 pg   MCHC 35.3 31.5 - 35.7 g/dL   RDW 13.2 11.6 - 15.4 %   Platelets 255 150 - 450 x10E3/uL   Neutrophils 51 Not Estab. %   Lymphs 31 Not Estab. %   Monocytes 10 Not Estab. %   Eos 7 Not Estab. %   Basos 1 Not Estab. %   Neutrophils Absolute 3.3 1.4 - 7.0 x10E3/uL    Lymphocytes Absolute 2.0 0.7 - 3.1 x10E3/uL   Monocytes Absolute 0.6 0.1 - 0.9 x10E3/uL   EOS (ABSOLUTE) 0.4 0.0 - 0.4 x10E3/uL   Basophils Absolute 0.1 0.0 - 0.2 x10E3/uL   Immature Granulocytes 0 Not Estab. %   Immature Grans (Abs) 0.0 0.0 - 0.1 x10E3/uL  CMP14+EGFR  Result Value Ref Range   Glucose 93 65 - 99 mg/dL   BUN 13 6 - 24 mg/dL   Creatinine, Ser 1.37 (H) 0.76 - 1.27 mg/dL   GFR calc non Af Amer 59 (L) >59 mL/min/1.73   GFR calc Af Amer 69 >59 mL/min/1.73   BUN/Creatinine Ratio 9 9 - 20   Sodium 140 134 - 144 mmol/L   Potassium 4.1 3.5 - 5.2 mmol/L   Chloride 103 96 - 106 mmol/L   CO2 22 20 - 29 mmol/L   Calcium 9.1 8.7 - 10.2 mg/dL   Total Protein 6.5 6.0 - 8.5 g/dL   Albumin 4.4 3.8 - 4.9 g/dL   Globulin, Total 2.1 1.5 - 4.5 g/dL   Albumin/Globulin Ratio 2.1 1.2 - 2.2   Bilirubin Total 0.5 0.0 - 1.2 mg/dL   Alkaline Phosphatase 95 39 - 117 IU/L   AST 22 0 - 40 IU/L   ALT 23 0 - 44 IU/L  Lipid panel  Result Value Ref Range   Cholesterol, Total 217 (H) 100 - 199 mg/dL   Triglycerides 310 (H) 0 - 149 mg/dL   HDL 43 >39 mg/dL   VLDL Cholesterol Cal 62 (H) 5 - 40 mg/dL   LDL Calculated 112 (H) 0 - 99 mg/dL   Chol/HDL Ratio 5.0 0.0 - 5.0 ratio  VITAMIN D 25 Hydroxy (Vit-D Deficiency, Fractures)  Result Value Ref Range   Vit D, 25-Hydroxy 28.9 (L) 30.0 - 100.0 ng/mL  TSH  Result Value Ref Range   TSH 1.420 0.450 - 4.500 uIU/mL  Testosterone,Free and Total  Result Value Ref Range   Testosterone 352 264 - 916 ng/dL   Testosterone, Free 10.6 7.2 - 24.0 pg/mL  Prolactin  Result Value Ref Range   Prolactin 13.6 4.0 - 15.2 ng/mL     X-Ray: left wrist: No acute findings. Preliminary x-ray reading by Monia Pouch, FNP-C, [facility]. X-Ray: left elbow: No acute findings. Preliminary x-ray reading by Monia Pouch, FNP-C, [facility].   Pertinent labs & imaging results that were available during my care of the patient were reviewed by me and considered in my medical decision  making.  Assessment & Plan:  Gregory Charles was seen today for fall.  Diagnoses and all orders for this visit:  Fall, initial encounter Imaging unremarkable in office, will notify pt if radiology reading differs.  -     DG Elbow 2 Views Left; Future -     DG Wrist Complete Left; Future  Left wrist pain Imaging unremarkable. Symptoms reported consistent with radiculopathy. Will treat with burst of steroids. Symptomatic care discussed. Report any new or worsening symptoms.  -     DG Wrist Complete Left; Future -  predniSONE (DELTASONE) 20 MG tablet; 2 po at sametime daily for 5 days  Left elbow pain Imaging unremarkable. Symptoms reported consistent with radiculopathy. Will treat with burst of steroids. Symptomatic care discussed. Report any new or worsening symptoms.  -     DG Elbow 2 Views Left; Future -     predniSONE (DELTASONE) 20 MG tablet; 2 po at sametime daily for 5 days  Radiculopathy of arm Imaging unremarkable. Symptoms reported consistent with radiculopathy. Will treat with burst of steroids. Symptomatic care discussed. Report any new or worsening symptoms.  -     predniSONE (DELTASONE) 20 MG tablet; 2 po at sametime daily for 5 days     Continue all other maintenance medications.  Follow up plan: Return if symptoms worsen or fail to improve.  Continue healthy lifestyle choices, including diet (rich in fruits, vegetables, and lean proteins, and low in salt and simple carbohydrates) and exercise (at least 30 minutes of moderate physical activity daily).  Educational handout given for wrist pain  The above assessment and management plan was discussed with the patient. The patient verbalized understanding of and has agreed to the management plan. Patient is aware to call the clinic if they develop any new symptoms or if symptoms persist or worsen. Patient is aware when to return to the clinic for a follow-up visit. Patient educated on when it is appropriate to go to the  emergency department.   Monia Pouch, FNP-C [facility] [phone]

## 2019-04-16 NOTE — Patient Instructions (Signed)
Wrist Pain, Adult There are many things that can cause wrist pain. Some common causes include:  An injury to the wrist area.  Overuse of the joint.  A condition that causes too much pressure to be put on a nerve in the wrist (carpal tunnel syndrome).  Wear and tear of the joints that happens as a person gets older (osteoarthritis).  Other types of arthritis. Sometimes, the cause of wrist pain is not known. Often, the pain goes away when you follow your doctor's instructions for helping pain at home, such as resting or icing your wrist. If your wrist pain does not go away, it is important to tell your doctor. Follow these instructions at home:  Rest the wrist area for 48 hours or more, or as long as told by your doctor.  If a splint or elastic bandage has been put on your wrist, use it as told by your doctor. ? Take off the splint or bandage only as told by your doctor. ? Loosen the splint or bandage if your fingers tingle, lose feeling (get numb), or turn cold or blue.  If directed, apply ice to the injured area: ? If you have a removable splint or elastic bandage, remove it as told by your doctor. ? Put ice in a plastic bag. ? Place a towel between your skin and the bag or between your splint or bandage and the bag. ? Leave the ice on for 20 minutes, 2-3 times a day.   Keep your arm raised (elevated) above the level of your heart while you are sitting or lying down.  Take over-the-counter and prescription medicines only as told by your doctor.  Keep all follow-up visits as told by your doctor. This is important. Contact a doctor if:  You have a sudden sharp pain in the wrist, hand, or arm that is different or new.  The swelling or bruising on your wrist or hand gets worse.  Your skin becomes red, gets a rash, or has open sores.  Your pain does not get better or it gets worse. Get help right away if:  You lose feeling in your fingers or hand.  Your fingers turn white,  very red, or cold and blue.  You cannot move your fingers.  You have a fever or chills. This information is not intended to replace advice given to you by your health care provider. Make sure you discuss any questions you have with your health care provider. Document Revised: 02/25/2017 Document Reviewed: 10/02/2015 Elsevier Patient Education  2020 [employer]    

## 2021-03-23 IMAGING — DX DG ELBOW 2V*L*
2 series · 2 of 2 positions shown · non-contrast
Comparison: None.

CLINICAL DATA: Pain, fall 2 months ago

EXAM:
LEFT ELBOW - 2 VIEW

[elbow ap]
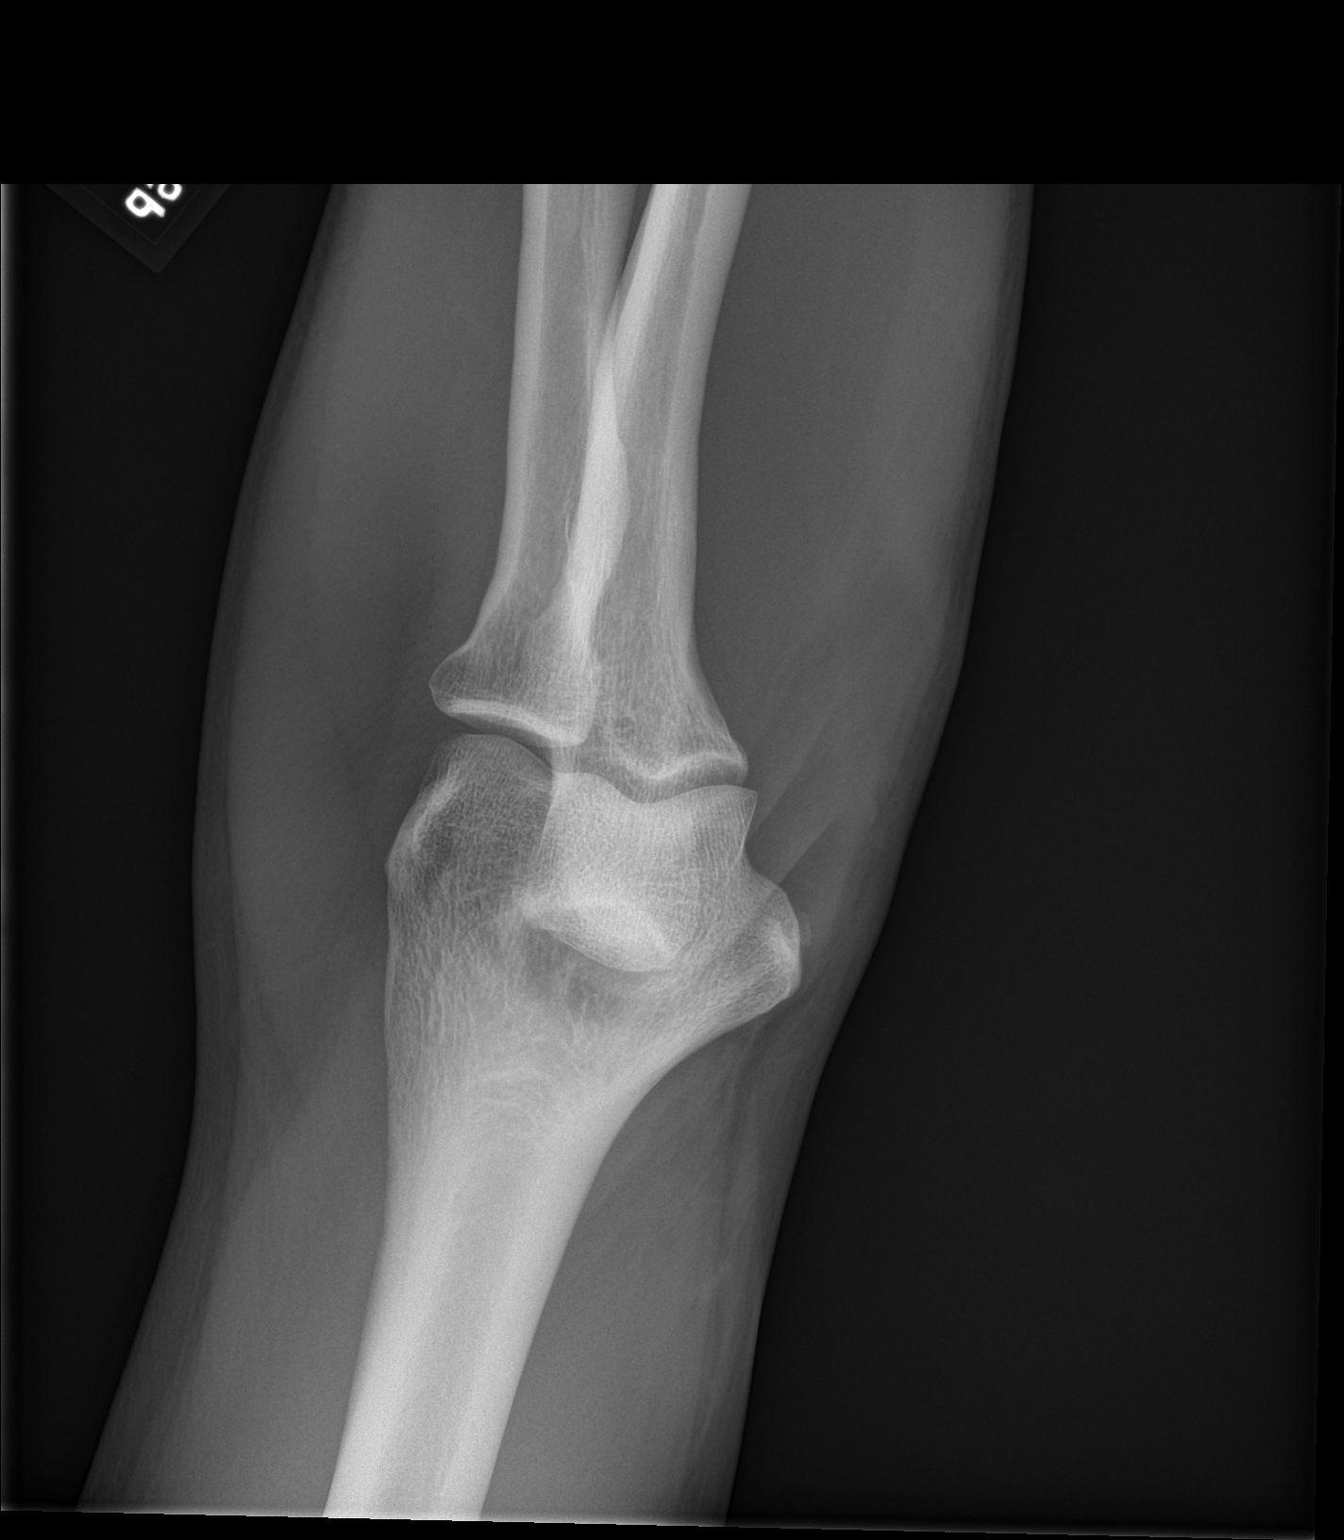

[elbow lat]
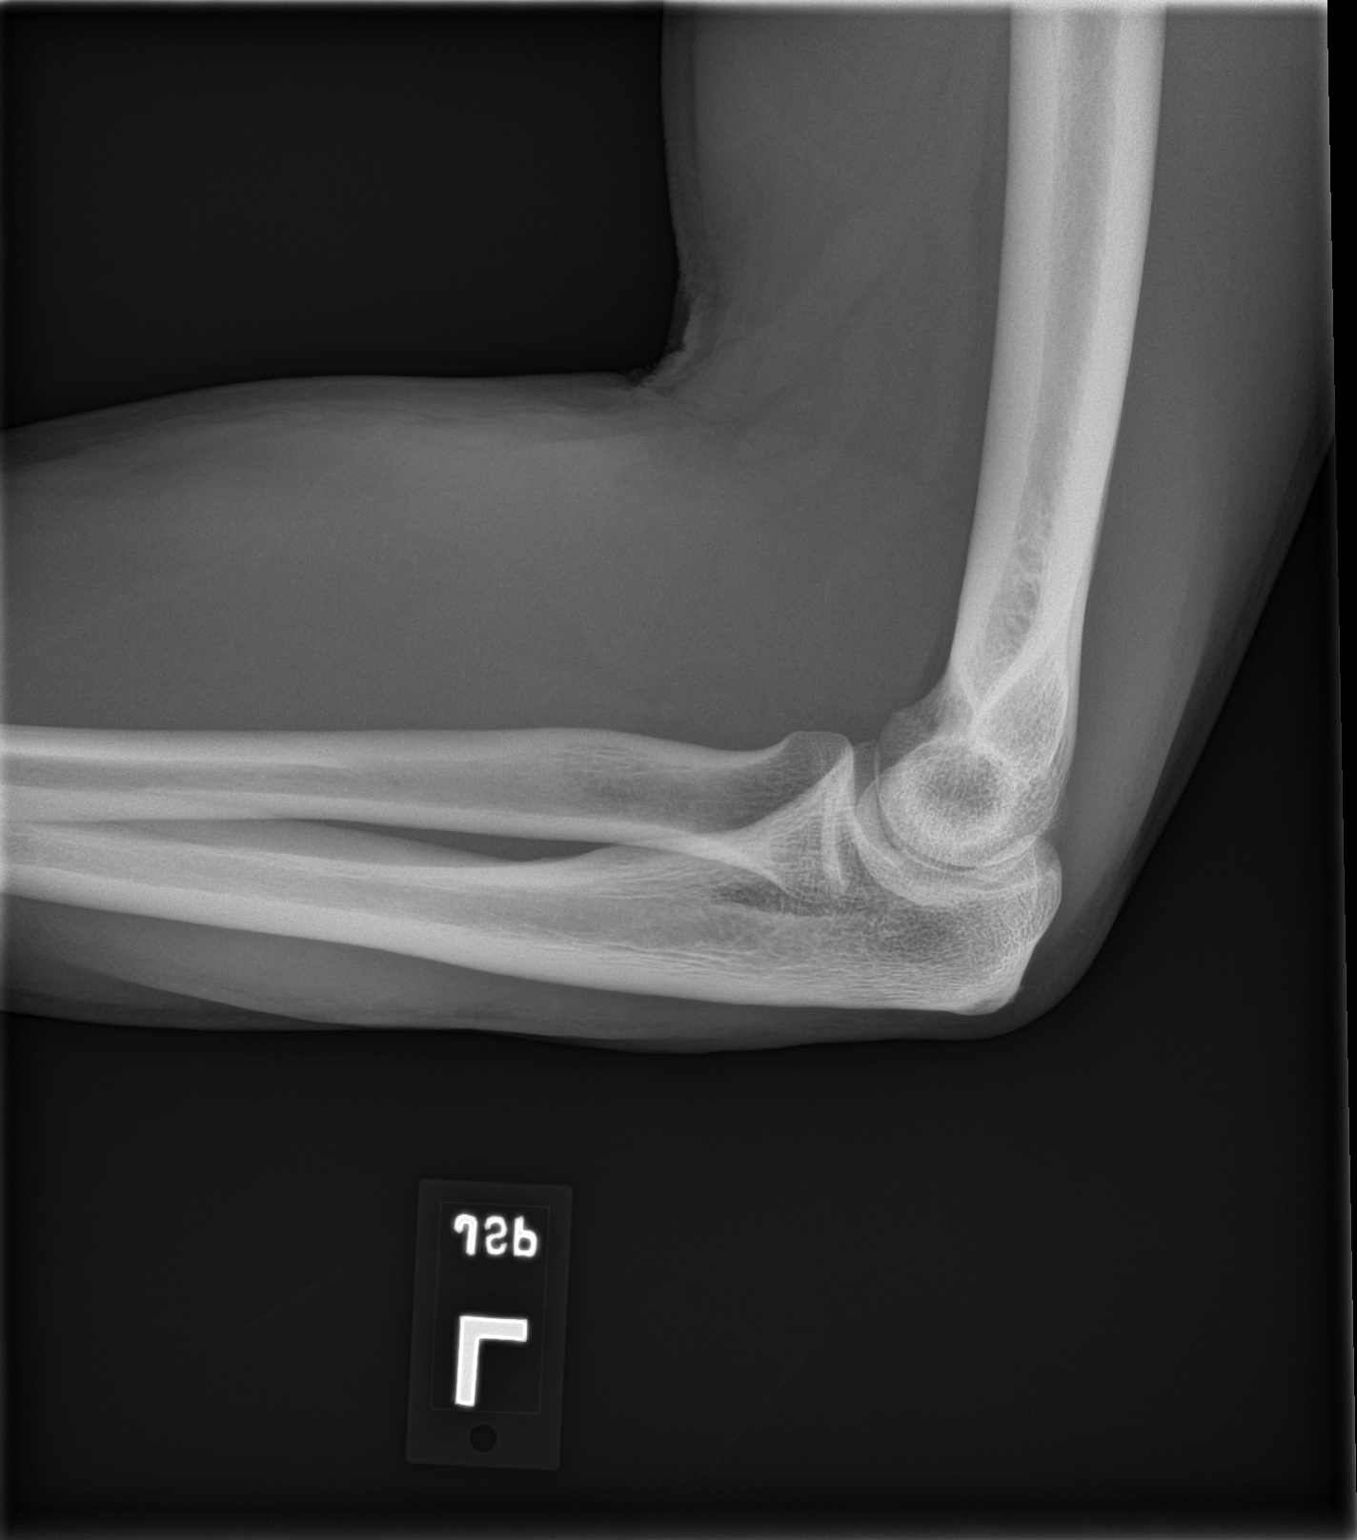

[2 of 2 positions shown; findings below may reference images not displayed]

FINDINGS: No fracture or dislocation of the left elbow. Joint spaces are well
preserved. No elbow joint effusion. Soft tissues are unremarkable.
IMPRESSION: No fracture or dislocation of the left elbow. Joint spaces are well
preserved. No elbow joint effusion.
